# Patient Record
Sex: Female | Born: 1935 | Race: White | Hispanic: No | Smoking: Never smoker
Health system: Southern US, Community
[De-identification: ages and names within clinical notes are randomized; demographics above are authoritative.]

## PROBLEM LIST (undated history)

## (undated) DIAGNOSIS — I350 Nonrheumatic aortic (valve) stenosis: Secondary | ICD-10-CM

## (undated) DIAGNOSIS — F0280 Dementia in other diseases classified elsewhere without behavioral disturbance: Secondary | ICD-10-CM

## (undated) DIAGNOSIS — E785 Hyperlipidemia, unspecified: Secondary | ICD-10-CM

## (undated) DIAGNOSIS — C439 Malignant melanoma of skin, unspecified: Secondary | ICD-10-CM

## (undated) DIAGNOSIS — R296 Repeated falls: Secondary | ICD-10-CM

## (undated) DIAGNOSIS — H5440 Blindness, one eye, unspecified eye: Secondary | ICD-10-CM

## (undated) DIAGNOSIS — G309 Alzheimer's disease, unspecified: Secondary | ICD-10-CM

## (undated) DIAGNOSIS — I639 Cerebral infarction, unspecified: Secondary | ICD-10-CM

## (undated) DIAGNOSIS — I10 Essential (primary) hypertension: Secondary | ICD-10-CM

## (undated) DIAGNOSIS — I509 Heart failure, unspecified: Secondary | ICD-10-CM

## (undated) HISTORY — DX: Hyperlipidemia, unspecified: E78.5

## (undated) HISTORY — PX: CHOLECYSTECTOMY: SHX55

## (undated) HISTORY — DX: Essential (primary) hypertension: I10

## (undated) HISTORY — DX: Alzheimer's disease, unspecified: G30.9

## (undated) HISTORY — DX: Heart failure, unspecified: I50.9

## (undated) HISTORY — PX: PARTIAL COLECTOMY: SHX5273

## (undated) HISTORY — DX: Nonrheumatic aortic (valve) stenosis: I35.0

## (undated) HISTORY — DX: Dementia in other diseases classified elsewhere, unspecified severity, without behavioral disturbance, psychotic disturbance, mood disturbance, and anxiety: F02.80

## (undated) HISTORY — DX: Malignant melanoma of skin, unspecified: C43.9

## (undated) HISTORY — DX: Cerebral infarction, unspecified: I63.9

## (undated) HISTORY — DX: Repeated falls: R29.6

## (undated) HISTORY — DX: Blindness, one eye, unspecified eye: H54.40

---

## 2004-08-15 ENCOUNTER — Emergency Department (HOSPITAL_COMMUNITY): Admission: EM | Admit: 2004-08-15 | Discharge: 2004-08-15 | Payer: Self-pay | Admitting: Emergency Medicine

## 2005-02-23 ENCOUNTER — Emergency Department (HOSPITAL_COMMUNITY): Admission: EM | Admit: 2005-02-23 | Discharge: 2005-02-23 | Payer: Self-pay | Admitting: Emergency Medicine

## 2009-06-15 ENCOUNTER — Observation Stay (HOSPITAL_COMMUNITY): Admission: EM | Admit: 2009-06-15 | Discharge: 2009-06-19 | Payer: Self-pay | Admitting: Emergency Medicine

## 2009-06-15 ENCOUNTER — Ambulatory Visit: Payer: Self-pay | Admitting: Family Medicine

## 2009-06-15 ENCOUNTER — Ambulatory Visit: Payer: Self-pay | Admitting: Cardiovascular Disease

## 2009-12-23 ENCOUNTER — Emergency Department (HOSPITAL_COMMUNITY)
Admission: EM | Admit: 2009-12-23 | Discharge: 2009-12-23 | Payer: Self-pay | Source: Home / Self Care | Admitting: Emergency Medicine

## 2010-04-12 ENCOUNTER — Emergency Department (HOSPITAL_COMMUNITY)
Admission: EM | Admit: 2010-04-12 | Discharge: 2010-04-12 | Payer: Self-pay | Source: Home / Self Care | Admitting: Emergency Medicine

## 2010-06-27 LAB — URINALYSIS, ROUTINE W REFLEX MICROSCOPIC
Bilirubin Urine: NEGATIVE
Glucose, UA: NEGATIVE mg/dL
Ketones, ur: 15 mg/dL — AB
Specific Gravity, Urine: 1.009 (ref 1.005–1.030)
Urobilinogen, UA: 0.2 mg/dL (ref 0.0–1.0)
pH: 7.5 (ref 5.0–8.0)

## 2010-06-27 LAB — BASIC METABOLIC PANEL
BUN: 13 mg/dL (ref 6–23)
CO2: 25 mEq/L (ref 19–32)
GFR calc Af Amer: >60 mL/min (ref >60)
GFR calc non Af Amer: >60 mL/min (ref >60)
Glucose, Bld: 107 mg/dL — ABNORMAL HIGH (ref 70–99)
Potassium: 3.7 mEq/L (ref 3.5–5.1)
Sodium: 141 mEq/L (ref 135–145)

## 2010-06-27 LAB — URINE MICROSCOPIC-ADD ON

## 2010-06-27 LAB — POCT CARDIAC MARKERS: CKMB, poc: 1.2 ng/mL (ref 1.0–8.0)

## 2010-06-27 LAB — CBC
MCHC: 34 g/dL (ref 30.0–36.0)
Platelets: 163 10*3/uL (ref 150–400)
RBC: 5.26 MIL/uL — ABNORMAL HIGH (ref 3.87–5.11)

## 2010-06-30 LAB — URINALYSIS, ROUTINE W REFLEX MICROSCOPIC
Bilirubin Urine: NEGATIVE
Ketones, ur: 40 mg/dL — AB
Protein, ur: NEGATIVE mg/dL
Specific Gravity, Urine: 1.015 (ref 1.005–1.030)
Urobilinogen, UA: 0.2 mg/dL (ref 0.0–1.0)
pH: 6 (ref 5.0–8.0)

## 2010-06-30 LAB — COMPREHENSIVE METABOLIC PANEL
Albumin: 4.1 g/dL (ref 3.5–5.2)
CO2: 27 mEq/L (ref 19–32)
Calcium: 9.8 mg/dL (ref 8.4–10.5)
Chloride: 105 mEq/L (ref 96–112)
GFR calc Af Amer: >60 mL/min (ref >60)
Glucose, Bld: 136 mg/dL — ABNORMAL HIGH (ref 70–99)
Potassium: 3.6 mEq/L (ref 3.5–5.1)
Sodium: 140 mEq/L (ref 135–145)
Total Protein: 7.3 g/dL (ref 6.0–8.3)

## 2010-06-30 LAB — DIFFERENTIAL
Basophils Absolute: 0 10*3/uL (ref 0.0–0.1)
Basophils Relative: 0 % (ref 0–1)
Eosinophils Absolute: 0.1 10*3/uL (ref 0.0–0.7)
Eosinophils Relative: 1 % (ref 0–5)
Lymphocytes Relative: 5 % — ABNORMAL LOW (ref 12–46)
Monocytes Absolute: 0.5 10*3/uL (ref 0.1–1.0)

## 2010-06-30 LAB — CBC
HCT: 42.8 % (ref 36.0–46.0)
Hemoglobin: 14.2 g/dL (ref 12.0–15.0)
MCV: 86.5 fL (ref 78.0–100.0)
Platelets: 183 10*3/uL (ref 150–400)
RDW: 14.7 % (ref 11.5–15.5)

## 2010-06-30 LAB — URINE CULTURE: Culture  Setup Time: 201109081435

## 2010-07-10 LAB — DIFFERENTIAL
Basophils Absolute: 0 10*3/uL (ref 0.0–0.1)
Basophils Relative: 0 % (ref 0–1)
Eosinophils Absolute: 0.1 10*3/uL (ref 0.0–0.7)
Eosinophils Relative: 2 % (ref 0–5)
Lymphocytes Relative: 14 % (ref 12–46)
Lymphs Abs: 1.2 10*3/uL (ref 0.7–4.0)
Monocytes Absolute: 0.5 10*3/uL (ref 0.1–1.0)
Monocytes Relative: 6 % (ref 3–12)
Neutro Abs: 6.7 10*3/uL (ref 1.7–7.7)
Neutrophils Relative %: 79 % — ABNORMAL HIGH (ref 43–77)

## 2010-07-10 LAB — CBC
HCT: 39 % (ref 36.0–46.0)
HCT: 43.6 % (ref 36.0–46.0)
Hemoglobin: 13.1 g/dL (ref 12.0–15.0)
Hemoglobin: 14.6 g/dL (ref 12.0–15.0)
MCHC: 33.6 g/dL (ref 30.0–36.0)
MCHC: 33.6 g/dL (ref 30.0–36.0)
MCV: 88.3 fL (ref 78.0–100.0)
MCV: 88.3 fL (ref 78.0–100.0)
Platelets: 194 10*3/uL (ref 150–400)
Platelets: 200 10*3/uL (ref 150–400)
RBC: 4.42 MIL/uL (ref 3.87–5.11)
RBC: 4.94 MIL/uL (ref 3.87–5.11)
RDW: 14.5 % (ref 11.5–15.5)
RDW: 14.9 % (ref 11.5–15.5)
WBC: 7.6 10*3/uL (ref 4.0–10.5)
WBC: 8.6 10*3/uL (ref 4.0–10.5)

## 2010-07-10 LAB — BASIC METABOLIC PANEL WITH GFR
BUN: 15 mg/dL (ref 6–23)
BUN: 17 mg/dL (ref 6–23)
CO2: 24 meq/L (ref 19–32)
CO2: 25 meq/L (ref 19–32)
Calcium: 9.3 mg/dL (ref 8.4–10.5)
Calcium: 9.9 mg/dL (ref 8.4–10.5)
Chloride: 101 meq/L (ref 96–112)
Chloride: 102 meq/L (ref 96–112)
Creatinine, Ser: 0.83 mg/dL (ref 0.4–1.2)
Creatinine, Ser: 0.9 mg/dL (ref 0.4–1.2)
GFR calc non Af Amer: 59 mL/min — ABNORMAL LOW
GFR calc non Af Amer: >60 mL/min
Glucose, Bld: 104 mg/dL — ABNORMAL HIGH (ref 70–99)
Glucose, Bld: 110 mg/dL — ABNORMAL HIGH (ref 70–99)
Potassium: 3.3 meq/L — ABNORMAL LOW (ref 3.5–5.1)
Potassium: 3.6 meq/L (ref 3.5–5.1)
Sodium: 135 meq/L (ref 135–145)
Sodium: 135 meq/L (ref 135–145)

## 2010-07-10 LAB — BASIC METABOLIC PANEL
CO2: 22 mEq/L (ref 19–32)
Calcium: 8.8 mg/dL (ref 8.4–10.5)
Calcium: 9 mg/dL (ref 8.4–10.5)
Chloride: 110 mEq/L (ref 96–112)
Chloride: 112 mEq/L (ref 96–112)
Creatinine, Ser: 0.67 mg/dL (ref 0.4–1.2)
GFR calc Af Amer: >60 mL/min (ref >60)
GFR calc Af Amer: >60 mL/min (ref >60)
GFR calc Af Amer: >60 mL/min (ref >60)
GFR calc non Af Amer: >60 mL/min (ref >60)
Glucose, Bld: 105 mg/dL — ABNORMAL HIGH (ref 70–99)
Potassium: 3.1 mEq/L — ABNORMAL LOW (ref 3.5–5.1)
Potassium: 5.1 mEq/L (ref 3.5–5.1)
Sodium: 138 mEq/L (ref 135–145)
Sodium: 140 mEq/L (ref 135–145)
Sodium: 142 mEq/L (ref 135–145)

## 2010-07-10 LAB — URINALYSIS, ROUTINE W REFLEX MICROSCOPIC
Bilirubin Urine: NEGATIVE
Glucose, UA: NEGATIVE mg/dL
Hgb urine dipstick: NEGATIVE
Ketones, ur: NEGATIVE mg/dL
Nitrite: NEGATIVE
Protein, ur: NEGATIVE mg/dL
Specific Gravity, Urine: 1.011 (ref 1.005–1.030)
Urobilinogen, UA: 0.2 mg/dL (ref 0.0–1.0)
pH: 7 (ref 5.0–8.0)

## 2010-07-10 LAB — POTASSIUM
Potassium: 2.9 meq/L — ABNORMAL LOW (ref 3.5–5.1)
Potassium: 3.4 meq/L — ABNORMAL LOW (ref 3.5–5.1)
Potassium: 4.2 meq/L (ref 3.5–5.1)

## 2010-07-10 LAB — URINE MICROSCOPIC-ADD ON

## 2010-07-10 LAB — URINE CULTURE: Colony Count: >=100000

## 2010-07-10 LAB — MAGNESIUM: Magnesium: 2.4 mg/dL (ref 1.5–2.5)

## 2010-07-25 ENCOUNTER — Emergency Department (HOSPITAL_COMMUNITY): Payer: Medicare Other

## 2010-07-25 ENCOUNTER — Emergency Department (HOSPITAL_COMMUNITY)
Admission: EM | Admit: 2010-07-25 | Discharge: 2010-07-25 | Disposition: A | Discharge status: Home / Self Care | Payer: Medicare Other | Attending: Emergency Medicine | Admitting: Emergency Medicine

## 2010-07-25 DIAGNOSIS — I509 Heart failure, unspecified: Secondary | ICD-10-CM | POA: Insufficient documentation

## 2010-07-25 DIAGNOSIS — E785 Hyperlipidemia, unspecified: Secondary | ICD-10-CM | POA: Insufficient documentation

## 2010-07-25 DIAGNOSIS — S0003XA Contusion of scalp, initial encounter: Secondary | ICD-10-CM | POA: Insufficient documentation

## 2010-07-25 DIAGNOSIS — W06XXXA Fall from bed, initial encounter: Secondary | ICD-10-CM | POA: Insufficient documentation

## 2010-07-25 DIAGNOSIS — Z7982 Long term (current) use of aspirin: Secondary | ICD-10-CM | POA: Insufficient documentation

## 2010-07-25 DIAGNOSIS — Z79899 Other long term (current) drug therapy: Secondary | ICD-10-CM | POA: Insufficient documentation

## 2010-07-25 DIAGNOSIS — I10 Essential (primary) hypertension: Secondary | ICD-10-CM | POA: Insufficient documentation

## 2010-07-25 DIAGNOSIS — G309 Alzheimer's disease, unspecified: Secondary | ICD-10-CM | POA: Insufficient documentation

## 2010-07-25 DIAGNOSIS — S0990XA Unspecified injury of head, initial encounter: Secondary | ICD-10-CM | POA: Insufficient documentation

## 2010-07-25 DIAGNOSIS — H5440 Blindness, one eye, unspecified eye: Secondary | ICD-10-CM | POA: Insufficient documentation

## 2010-07-25 DIAGNOSIS — F0280 Dementia in other diseases classified elsewhere without behavioral disturbance: Secondary | ICD-10-CM | POA: Insufficient documentation

## 2010-07-25 DIAGNOSIS — Y9210 Unspecified residential institution as the place of occurrence of the external cause: Secondary | ICD-10-CM | POA: Insufficient documentation

## 2010-07-25 DIAGNOSIS — Z8673 Personal history of transient ischemic attack (TIA), and cerebral infarction without residual deficits: Secondary | ICD-10-CM | POA: Insufficient documentation

## 2010-07-25 DIAGNOSIS — S5010XA Contusion of unspecified forearm, initial encounter: Secondary | ICD-10-CM | POA: Insufficient documentation

## 2010-09-30 ENCOUNTER — Emergency Department (HOSPITAL_COMMUNITY): Payer: Medicare Other

## 2010-09-30 ENCOUNTER — Emergency Department (HOSPITAL_COMMUNITY)
Admission: EM | Admit: 2010-09-30 | Discharge: 2010-09-30 | Disposition: A | Discharge status: Home / Self Care | Payer: Medicare Other | Attending: Emergency Medicine | Admitting: Emergency Medicine

## 2010-09-30 DIAGNOSIS — I10 Essential (primary) hypertension: Secondary | ICD-10-CM | POA: Insufficient documentation

## 2010-09-30 DIAGNOSIS — R109 Unspecified abdominal pain: Secondary | ICD-10-CM | POA: Insufficient documentation

## 2010-09-30 DIAGNOSIS — Y92009 Unspecified place in unspecified non-institutional (private) residence as the place of occurrence of the external cause: Secondary | ICD-10-CM | POA: Insufficient documentation

## 2010-09-30 DIAGNOSIS — Z8673 Personal history of transient ischemic attack (TIA), and cerebral infarction without residual deficits: Secondary | ICD-10-CM | POA: Insufficient documentation

## 2010-09-30 DIAGNOSIS — W19XXXA Unspecified fall, initial encounter: Secondary | ICD-10-CM | POA: Insufficient documentation

## 2010-09-30 DIAGNOSIS — H5440 Blindness, one eye, unspecified eye: Secondary | ICD-10-CM | POA: Insufficient documentation

## 2010-09-30 DIAGNOSIS — G309 Alzheimer's disease, unspecified: Secondary | ICD-10-CM | POA: Insufficient documentation

## 2010-09-30 DIAGNOSIS — F0280 Dementia in other diseases classified elsewhere without behavioral disturbance: Secondary | ICD-10-CM | POA: Insufficient documentation

## 2010-09-30 DIAGNOSIS — R079 Chest pain, unspecified: Secondary | ICD-10-CM | POA: Insufficient documentation

## 2010-09-30 DIAGNOSIS — S301XXA Contusion of abdominal wall, initial encounter: Secondary | ICD-10-CM | POA: Insufficient documentation

## 2010-09-30 DIAGNOSIS — I509 Heart failure, unspecified: Secondary | ICD-10-CM | POA: Insufficient documentation

## 2010-09-30 DIAGNOSIS — E785 Hyperlipidemia, unspecified: Secondary | ICD-10-CM | POA: Insufficient documentation

## 2010-09-30 LAB — URINE MICROSCOPIC-ADD ON

## 2010-09-30 LAB — DIFFERENTIAL
Eosinophils Relative: 3 % (ref 0–5)
Lymphocytes Relative: 18 % (ref 12–46)
Lymphs Abs: 1.7 10*3/uL (ref 0.7–4.0)
Monocytes Absolute: 0.6 10*3/uL (ref 0.1–1.0)

## 2010-09-30 LAB — URINALYSIS, ROUTINE W REFLEX MICROSCOPIC
Glucose, UA: NEGATIVE mg/dL
Protein, ur: NEGATIVE mg/dL
Specific Gravity, Urine: 1.017 (ref 1.005–1.030)
Urobilinogen, UA: 0.2 mg/dL (ref 0.0–1.0)

## 2010-09-30 LAB — CBC
HCT: 41.1 % (ref 36.0–46.0)
MCHC: 32.8 g/dL (ref 30.0–36.0)
MCV: 88.8 fL (ref 78.0–100.0)
RDW: 13.2 % (ref 11.5–15.5)

## 2010-09-30 LAB — BASIC METABOLIC PANEL
BUN: 22 mg/dL (ref 6–23)
Calcium: 9.8 mg/dL (ref 8.4–10.5)
Creatinine, Ser: 0.71 mg/dL (ref 0.50–1.10)
GFR calc Af Amer: >60 mL/min (ref >60)
GFR calc non Af Amer: >60 mL/min (ref >60)

## 2010-09-30 MED ORDER — IOHEXOL 300 MG/ML  SOLN
100.0000 mL | Freq: Once | INTRAMUSCULAR | Status: AC | PRN
  Administered 2010-09-30: 100 mL via INTRAVENOUS

## 2011-02-18 ENCOUNTER — Encounter: Payer: Self-pay | Admitting: Internal Medicine

## 2011-02-18 ENCOUNTER — Inpatient Hospital Stay (HOSPITAL_COMMUNITY)
Admission: EM | Admit: 2011-02-18 | Discharge: 2011-02-20 | DRG: 057 | Disposition: A | Discharge status: Skilled Nursing Facility | Payer: Medicare Other | Attending: Infectious Diseases | Admitting: Infectious Diseases

## 2011-02-18 ENCOUNTER — Emergency Department (HOSPITAL_COMMUNITY): Payer: Medicare Other

## 2011-02-18 DIAGNOSIS — T40605A Adverse effect of unspecified narcotics, initial encounter: Secondary | ICD-10-CM | POA: Diagnosis present

## 2011-02-18 DIAGNOSIS — I10 Essential (primary) hypertension: Secondary | ICD-10-CM | POA: Insufficient documentation

## 2011-02-18 DIAGNOSIS — M25559 Pain in unspecified hip: Secondary | ICD-10-CM | POA: Diagnosis present

## 2011-02-18 DIAGNOSIS — R32 Unspecified urinary incontinence: Secondary | ICD-10-CM | POA: Insufficient documentation

## 2011-02-18 DIAGNOSIS — E785 Hyperlipidemia, unspecified: Secondary | ICD-10-CM | POA: Insufficient documentation

## 2011-02-18 DIAGNOSIS — F0280 Dementia in other diseases classified elsewhere without behavioral disturbance: Principal | ICD-10-CM | POA: Diagnosis present

## 2011-02-18 DIAGNOSIS — G309 Alzheimer's disease, unspecified: Secondary | ICD-10-CM

## 2011-02-18 DIAGNOSIS — W19XXXA Unspecified fall, initial encounter: Secondary | ICD-10-CM | POA: Diagnosis present

## 2011-02-18 DIAGNOSIS — Z9181 History of falling: Secondary | ICD-10-CM

## 2011-02-18 DIAGNOSIS — I509 Heart failure, unspecified: Secondary | ICD-10-CM | POA: Insufficient documentation

## 2011-02-18 DIAGNOSIS — Y998 Other external cause status: Secondary | ICD-10-CM

## 2011-02-18 DIAGNOSIS — I639 Cerebral infarction, unspecified: Secondary | ICD-10-CM | POA: Insufficient documentation

## 2011-02-18 DIAGNOSIS — H5440 Blindness, one eye, unspecified eye: Secondary | ICD-10-CM | POA: Diagnosis present

## 2011-02-18 DIAGNOSIS — R296 Repeated falls: Secondary | ICD-10-CM | POA: Diagnosis present

## 2011-02-18 DIAGNOSIS — C439 Malignant melanoma of skin, unspecified: Secondary | ICD-10-CM | POA: Insufficient documentation

## 2011-02-18 DIAGNOSIS — N390 Urinary tract infection, site not specified: Secondary | ICD-10-CM | POA: Diagnosis present

## 2011-02-18 DIAGNOSIS — I350 Nonrheumatic aortic (valve) stenosis: Secondary | ICD-10-CM | POA: Insufficient documentation

## 2011-02-18 LAB — COMPREHENSIVE METABOLIC PANEL
AST: 19 U/L (ref 0–37)
Albumin: 3.5 g/dL (ref 3.5–5.2)
Alkaline Phosphatase: 51 U/L (ref 39–117)
Chloride: 107 mEq/L (ref 96–112)
Potassium: 3.6 mEq/L (ref 3.5–5.1)
Sodium: 141 mEq/L (ref 135–145)
Total Bilirubin: 0.6 mg/dL (ref 0.3–1.2)
Total Protein: 6.6 g/dL (ref 6.0–8.3)

## 2011-02-18 LAB — URINALYSIS, ROUTINE W REFLEX MICROSCOPIC
Glucose, UA: NEGATIVE mg/dL
Hgb urine dipstick: NEGATIVE
Ketones, ur: NEGATIVE mg/dL
Leukocytes, UA: NEGATIVE
Nitrite: NEGATIVE
Protein, ur: NEGATIVE mg/dL
Specific Gravity, Urine: 1.011 (ref 1.005–1.030)
Urobilinogen, UA: 0.2 mg/dL (ref 0.0–1.0)
pH: 7 (ref 5.0–8.0)
pH: 7.5 (ref 5.0–8.0)

## 2011-02-18 LAB — CBC
MCHC: 33.8 g/dL (ref 30.0–36.0)
Platelets: 176 10*3/uL (ref 150–400)
RDW: 13.7 % (ref 11.5–15.5)
WBC: 7 10*3/uL (ref 4.0–10.5)

## 2011-02-18 LAB — DIFFERENTIAL
Basophils Absolute: 0 10*3/uL (ref 0.0–0.1)
Basophils Relative: 0 % (ref 0–1)
Eosinophils Absolute: 0.2 10*3/uL (ref 0.0–0.7)
Eosinophils Relative: 3 % (ref 0–5)
Lymphocytes Relative: 29 % (ref 12–46)

## 2011-02-18 LAB — URINE MICROSCOPIC-ADD ON

## 2011-02-18 MED ORDER — SERTRALINE HCL 50 MG PO TABS
50.0000 mg | ORAL_TABLET | Freq: Every day | ORAL | Status: DC
  Filled 2011-02-18 (×3): qty 1

## 2011-02-18 MED ORDER — ACETAMINOPHEN 325 MG PO TABS: 650.0000 mg | ORAL_TABLET | Freq: Four times a day (QID) | ORAL | Status: DC | PRN

## 2011-02-18 MED ORDER — ENOXAPARIN SODIUM 40 MG/0.4ML ~~LOC~~ SOLN
40.0000 mg | Freq: Every day | SUBCUTANEOUS | Status: DC
  Administered 2011-02-19: 40 mg via SUBCUTANEOUS
  Filled 2011-02-18 (×3): qty 0.4

## 2011-02-18 MED ORDER — SODIUM CHLORIDE 0.9 % IJ SOLN
3.0000 mL | Freq: Two times a day (BID) | INTRAMUSCULAR | Status: DC
  Administered 2011-02-19 (×2): 3 mL via INTRAVENOUS

## 2011-02-18 MED ORDER — IPRATROPIUM BROMIDE 0.03 % NA SOLN: 2.0000 | Freq: Two times a day (BID) | NASAL | Status: DC | PRN

## 2011-02-18 MED ORDER — HYPROMELLOSE (GONIOSCOPIC) 2.5 % OP SOLN
1.0000 [drp] | Freq: Every day | OPHTHALMIC | Status: DC
  Filled 2011-02-18: qty 15

## 2011-02-18 MED ORDER — INFLUENZA VIRUS VACC SPLIT PF IM SUSP
0.5000 mL | Freq: Once | INTRAMUSCULAR | Status: DC
  Filled 2011-02-18: qty 0.5

## 2011-02-18 MED ORDER — TRAMADOL HCL 50 MG PO TABS
50.0000 mg | ORAL_TABLET | ORAL | Status: DC | PRN
  Filled 2011-02-18: qty 1

## 2011-02-18 MED ORDER — ASPIRIN EC 81 MG PO TBEC
81.0000 mg | DELAYED_RELEASE_TABLET | Freq: Every day | ORAL | Status: DC
  Administered 2011-02-19 – 2011-02-20 (×2): 81 mg via ORAL
  Filled 2011-02-18 (×2): qty 1

## 2011-02-18 NOTE — H&P (Signed)
Hospital Admission Note Date: 02/18/2011  Patient name: Ruth [name] Medical record number: [id number] Date of birth: 24-Jan-1917 Age: 77 y.o. Gender: female PCP: Dr. Florentina Jenny Lives at [facility] assisted living facility, p. [phone]  Medical Service: Internal Medicine Teaching Service   Attending physician: Johny Sax  1st Contact: Janalyn Harder   Pager: [phone]  2nd Contact: Almyra Deforest   Pager: [phone]  After 5 pm or weekends:  1st Contact:     Pager: [phone]  2nd Contact:     Pager: [phone]  Chief Complaint: Fall  History of Present Illness: The patient is a 77 yo woman, history of CHF, HTN, alzheimer's, R eye blindness, presenting s/p fall with right hip pain.  Most of the history was obtained from the patient's daughter, as the patient was confused at the time of the interview.  Around 1:30 a.m. on the morning of admission, the patient had an unwitnessed fall in her assisted living facility.  The daughter did not get a full report of the fall, but believes the patient did not hit her head or hit any furniture, and did not lose consciousness.  After the fall, the patient reported significant right hip pain, prompting her to seek medical attention.  The patient has a history of several prior falls in the last year, most recently 09/30/10, prompting ED evaluation, none of which resulted in fractures.  X-ray and MRI obtained in the ED showed no acute fracture at this time, but the patient continues to note significant pain.  The patient reports no other symptoms surrounding the fall, but the nursing staff at her assisted living facility reported decreased orientation during the week prior to the fall, and suspected a UTI.  Meds: Atrovent 0.03% nasal spray, 2 sprays BID prn Melatonin 3 mg PO, 0.5 tab qhs Aspirin 81 mg PO BID Ibuprofen 400 mg PO 2 tabs TID prn Artificial tears 1.4% ophthalmic solution, 1 drop to both eyes daily prn Co-Q 10, 100 mg PO qam MVI 1 tab PO  qam Ibandronate 150 mg PO monthly Sertraline 50 mg PO qhs Lovastatin 20 mg PO qhs Donepezil 10 mg PO qhs Memantine 5 mg PO BID Tylenol 500 mg PO BID Calcium carbonate/vit D, 600/400 mg PO BID Ramipril 5 mg PO qhs Genatmicin 40 mg/mL injection, 120 mg q12hrs  Allergies: Unknown prior pain medication - disorientation  PMH CHF - EF 55-60% on 06/18/09 Hypertension Allergic rhinitis Alzheimer's disease - with severe dementia Aortic stenosis Hyperlipidemia Melanoma Prior CVA Urinary incontinence R eye blindness - s/p CVA  Surg Hx Cholecystectomy Partial colectomy  SH Lives at assisted living facility, alone Non-smoker No alcohol use No drug use  Review of Systems: General: no fevers, chills, changes in weight, changes in appetite Skin: no rash HEENT: no blurry vision, hearing changes, sore throat Pulm: no dyspnea, coughing, wheezing CV: no chest pain, palpitations, shortness of breath Abd: no abdominal pain, nausea/vomiting, diarrhea/constipation GU: no dysuria, hematuria, polyuria Ext: no arthralgias, myalgias Neuro: no weakness, numbness, or tingling  Physical Exam: T: 97.0,  BP: 120/65,  HR: 68,  RR: 20,  O2 sat: 97% General: talkative, tangential, disoriented HEENT: left PERRL, right eye non-reactive, oropharynx moist and non-erythematous Neck: supple, no lymphadenopathy,no  JVD Lungs: clear to ascultation bilaterally, normal work of respiration, no wheezes, rales, ronchi Heart: regular rate and rhythm, Grade III/VI systolic murmur best heard at the RUSB Abdomen: soft, non-tender, non-distended, normal bowel sounds Extremities: R hip tender to palpation posteriorly, with no visible hematoma, laceration, or bony  protrusion. no cyanosis, clubbing, or edema Neurologic: alert & oriented X3, cranial nerves II-XII grossly intact, strength grossly intact, sensation intact to light touch  Lab results: Basic Metabolic Panel:  Basename 02/18/11 1004  NA 141  K 3.6   CL 107  CO2 24  GLUCOSE 92  BUN 15  CREATININE 0.76  CALCIUM 9.6  MG --  PHOS --   Liver Function Tests:  Basename 02/18/11 1004  AST 19  ALT 9  ALKPHOS 51  BILITOT 0.6  PROT 6.6  ALBUMIN 3.5   CBC:  Basename 02/18/11 1004  WBC 7.0  NEUTROABS 4.0  HGB 13.3  HCT 39.4  MCV 89.3  PLT 176   Urinalysis:  Color, Urine                             YELLOW            YELLOW  Appearance                               CLEAR             CLEAR  Specific Gravity                         1.014             1.005-1.030  pH                                       7.0               5.0-8.0  Urine Glucose                            NEGATIVE          NEG              mg/dL  Bilirubin                                NEGATIVE          NEG  Ketones                                  NEGATIVE          NEG              mg/dL  Blood                                    NEGATIVE          NEG  Protein                                  NEGATIVE          NEG              mg/dL  Urobilinogen  0.2               0.0-1.0          mg/dL  Nitrite                                  NEGATIVE          NEG  Leukocytes                               MODERATE   a      NEG Squamous Epithelial / LPF                RARE              RARE  WBC / HPF                                21-50             <3               WBC/hpf  Bacteria / HPF                           RARE              RARE  Imaging results:  Dg Lumbar Spine 2-3 Views  02/18/2011  *RADIOLOGY REPORT*  Clinical Data: Lower back pain status post fall.  LUMBAR SPINE - 2-3 VIEW  Comparison: 09/30/2010 CT  Findings: Multilevel degenerative changes.  Surgical clips project over the right upper quadrant and left mid abdomen. No acute fracture or dislocation identified.  The sacrum is obscured by overlying bowel gas.  IMPRESSION: Multilevel degenerative changes.  No acute fracture or dislocation identified.  Original Report Authenticated By:  Waneta Martins, M.D.   Dg Hip Complete Right  02/18/2011  *RADIOLOGY REPORT*  Clinical Data: Right hip pain status post fall.  RIGHT HIP - COMPLETE 2+ VIEW  Comparison: 06/15/2009  Findings: Diffuse osteopenia.  Advanced right hip DJD. No displaced acute fracture identified.  Enthesopathic changes along bilateral ischia and greater trochanter.  Surgical clips project over the lower abdomen.  Multilevel degenerative changes of the lumbosacral spine.  IMPRESSION: No acute displaced fracture identified. If clinical concern for a fracture persists, recommend MRI or a repeat radiograph in 5-7 days to evaluate for interval change or callus formation.  Original Report Authenticated By: Waneta Martins, M.D.   Mr Pelvis Wo Contrast  02/18/2011  *RADIOLOGY REPORT*  Clinical Data: Pelvic and hip pain, worse on the right, status post fall.  History of dementia.  MRI PELVIS WITHOUT CONTRAST  Technique:  Multi-planar multi-sequence MR imaging of the pelvis was performed following the standard protocol. No intravenous contrast was administered.  Comparison: Right hip radiographs 02/18/2011 and pelvic CT 09/30/2010.  Findings: There is mild asymmetric subcutaneous edema superiorly in the right buttock.  No focal fluid collection is identified.  There is no significant muscular edema.  The iliopsoas, gluteus and hamstring tendons are intact bilaterally.  There is bilateral enthesopathic change at the hamstring origins on the ischial tuberosities.  A small amount of hip joint fluid is present bilaterally.  There is no evidence of proximal femur fracture.  There is no bone marrow edema or displaced cortical  fracture in the pelvis.  Lower lumbar spine degenerative changes are noted.  The sacroiliac joints appear normal.  The visualized internal pelvic contents appear unremarkable aside from moderate sigmoid colon diverticulosis.  IMPRESSION:  1.  Suspected mild subcutaneous hematoma superiorly in the right buttock. 2.  No  evidence of pelvic fracture or dislocation.  Original Report Authenticated By: Gerrianne Scale, M.D.    Assessment & Plan by Problem: The patient is a 77 yo woman, history of CHF, HTN, alzheimer's disease, and R eye blindness, with a history of multiple prior falls, presenting with a fall to the right hip, with no radiologic evidence of fracture.  We will admit for pain control, and to facilitate placement.  1. Fall - likely a mechanical fall contributed to by dementia and R eye blindness.  Possibly also contributed to by a medication side effect.  Unlikely cause by hypoglycemia, given normal blood glucose on admission.  Patient will likely need a home environment with closer supervision to prevent future falls. -will keep patient oriented while in-hospital, and avoid any disorienting medications -patient will likely need increased care in her assisted living facility  2. R hip pain - s/p fall, no evidence of fracture on MRI.  Will control pain conservatively, and avoid over-sedation.  Patient given fentanyl in ED, with some proceeding disorientation. -tramadol for pain, may increase as needed or advance to morphine, while trying to avoid sedation -PT/OT consult to improve functional status  3. CHF - history of CHF, EF 55-60%.  No acute symptoms of CHF exacerbation, no evidence of volume overload -continue to monitor  4. Abnormal urinalysis - UA showed negative nitrates, moderate Leuk Esterase, rare squams, 21-50 WBC's.  Borderline for UTI.  It is unknown at this point if this was a clean catch urine, given history of urinary incontinence. -consider repeat UA vs empiric treatment -urine culture in progress for antibiotic sensitivities.  5. Dispo - likely d/c back to [facility] with an increased level of assistance, once pain is well-controlled -PT/OT assessment  6. Code status: Patient's family reports that she has a living will, and they will bring this document by the hospital soon.   Until further notice, patient is full code.     R2/3______________________________ Almyra Deforest, MD     R1________________________________ Janalyn Harder, MD  ATTENDING: I performed and/or observed a history and physical examination of the patient.  I discussed the case with the residents as noted and reviewed the residents' notes.  I agree with the findings and plan--please refer to the attending physician note for more details.  Signature________________________________  Printed Name_____________________________

## 2011-02-19 DIAGNOSIS — Z9181 History of falling: Secondary | ICD-10-CM

## 2011-02-19 MED ORDER — HALOPERIDOL LACTATE 5 MG/ML IJ SOLN: 1.0000 mg | INTRAMUSCULAR | Status: DC | PRN

## 2011-02-19 MED ORDER — POLYVINYL ALCOHOL 1.4 % OP SOLN
1.0000 [drp] | Freq: Every day | OPHTHALMIC | Status: DC
  Administered 2011-02-19 – 2011-02-20 (×2): 1 [drp] via OPHTHALMIC

## 2011-02-19 NOTE — Progress Notes (Signed)
Physical Therapy Evaluation Patient Details Name: Ruth [name] MRN: [id number] DOB: Aug 30, 1916 Today's Date: 02/19/2011  Problem List:  Patient Active Problem List  Diagnoses  . CHF (congestive heart failure)  . HTN (hypertension)  . Alzheimer's dementia  . Stroke  . Blindness of right eye  . Aortic stenosis  . Hyperlipidemia  . Urinary incontinence  . Melanoma  . Frequent falls    Past Medical History:  Past Medical History  Diagnosis Date  . CHF (congestive heart failure)     EF 55-60% on 06/18/09  . HTN (hypertension)   . Alzheimer's dementia     significant dementia  . Stroke     History of CVA, with resulting R eye blindness  . Blindness of right eye     s/p stroke  . Aortic stenosis   . Hyperlipidemia   . Urinary incontinence     wears diapers at baseline  . Melanoma   . Frequent falls     several falls, likely mechanical   Past Surgical History:  Past Surgical History  Procedure Date  . Cholecystectomy   . Partial colectomy     PT Assessment/Plan/Recommendation PT Assessment Clinical Impression Statement: Pt at increased risk for falls secondary to dynamic/static balance deficits. Pt has also had a decline in cognition which amplifies fall risk. Recommend SNF with eventual continuation of care at Memory Unit at ALF if family agrees.  PT Recommendation/Assessment: Patient will need skilled PT in the acute care venue PT Problem List: Decreased strength;Decreased activity tolerance;Decreased balance;Decreased mobility;Decreased cognition;Decreased knowledge of use of DME;Decreased safety awareness;Cardiopulmonary status limiting activity Barriers to Discharge: Decreased caregiver support Barriers to Discharge Comments: Pt needs 24 hour supervision PT Therapy Diagnosis : Difficulty walking;Abnormality of gait;Generalized weakness;Altered mental status PT Plan PT Frequency: Min 3X/week PT Treatment/Interventions: DME instruction;Gait training;Functional  mobility training;Therapeutic exercise;Balance training;Neuromuscular re-education;Patient/family education PT Recommendation Recommendations for Other Services:  (None) Follow Up Recommendations: Skilled nursing facility Equipment Recommended: None recommended by PT PT Goals  Acute Rehab PT Goals PT Goal Formulation: With patient/family Time For Goal Achievement: 2 weeks Pt will Roll Supine to Right Side: with supervision PT Goal: Rolling Supine to Right Side - Progress: Progressing toward goal Pt will Roll Supine to Left Side: with supervision PT Goal: Rolling Supine to Left Side - Progress: Progressing toward goal Pt will go Supine/Side to Sit: with supervision PT Goal: Supine/Side to Sit - Progress: Progressing toward goal Pt will go Sit to Supine/Side: with supervision PT Goal: Sit to Supine/Side - Progress: Progressing toward goal Pt will Transfer Sit to Stand/Stand to Sit: with supervision PT Transfer Goal: Sit to Stand/Stand to Sit - Progress: Progressing toward goal Pt will Ambulate: 51 - 150 feet;with supervision PT Goal: Ambulate - Progress: Progressing toward goal  PT Evaluation Precautions/Restrictions    Prior Functioning  Home Living Lives With:  (at ALF) Receives Help From:  (ALF) Type of Home: Assisted living Home Adaptive Equipment: Walker - rolling Prior Function Level of Independence: Needs assistance with ADLs;Requires assistive device for independence;Needs assistance with gait Bath: Moderate Toileting: Moderate Dressing: Moderate Grooming: Moderate Feeding: Minimal Driving: No Vocation: Unemployed Financial risk analyst Arousal/Alertness: Awake/alert Overall Cognitive Status: History of cognitive impairments History of Cognitive Impairment: Decline in baseline functioning Memory: Appears impaired Memory Deficits: Decreased ability to remember corrections, unable to remember son earlier, decreased processing.  Following Commands: Follows one step  commands inconsistently;Follows one step commands with increased time Safety/Judgement: Decreased awareness of safety precautions Decreased Safety/Judgement: Decreased awareness of need for  assistance Sensation/Coordination Sensation Additional Comments: unable to accurately assess secondary to cognition Extremity Assessment RLE Assessment RLE Assessment:  (Generalized deconditioning, grossly >/= 3-/5) LLE Assessment LLE Assessment:  (Generalized deconditioning, grossly >/= 3-/5. ) Mobility (including Balance) Bed Mobility Bed Mobility: Yes Supine to Sit: 3: Mod assist Supine to Sit Details (indicate cue type and reason): Verbal cues for sequence, tactile cues for UE use and placement/advancement for Rt.LE. Transfers Transfers: Yes Sit to Stand: 4: Min assist;From bed;From chair/3-in-1 Sit to Stand Details (indicate cue type and reason): Verbal cues for UE placement. Stand to Sit: 3: Mod assist;To chair/3-in-1 Stand to Sit Details: Verbal/tactile cues for use of armrest and to control descent Ambulation/Gait Ambulation/Gait: Yes Ambulation/Gait Assistance: 3: Mod assist Ambulation/Gait Assistance Details (indicate cue type and reason): PT to assist with negotiation of RW, support pt secondary to decreased balance and lower extremity weakness.  Ambulation Distance (Feet): 35 Feet Assistive device: Rolling walker Gait Pattern: Shuffle (Some knee buckling, excessive hip/knee flexion) Stairs: No Wheelchair Mobility Wheelchair Mobility: No  Posture/Postural Control Posture/Postural Control: Postural limitations Balance Balance Assessed: Yes Static Standing Balance Static Standing - Balance Support: Left upper extremity supported Static Standing - Level of Assistance: 4: Min assist Static Standing - Comment/# of Minutes: Approximately 3-4 min Dynamic Standing Balance Dynamic Standing - Balance Support: No upper extremity supported Dynamic Standing - Level of Assistance: 3: Mod  assist Dynamic Standing - Balance Activities: Reaching for objects (Pt requiring frequent UE support) Exercise    End of Session PT - End of Session Equipment Utilized During Treatment: Gait belt Activity Tolerance: Patient tolerated treatment well;Patient limited by fatigue Patient left: in chair;with call bell in reach;with family/visitor present Nurse Communication: Mobility status for ambulation General Behavior During Session: [facility] for tasks performed Cognition: Impaired, at baseline  Sherie Don 02/19/2011, 2:09 PM Thereasa Parkin PT, DPT is my name by marriage and on my boards currently. Working on changing in the system!

## 2011-02-19 NOTE — Patient Instructions (Signed)
Pt's daughter instructed pt to have 24 hour supervision currently and to call nursing if she is to leave.

## 2011-02-19 NOTE — Progress Notes (Signed)
CSW met with pt's dtr, Ruth [name], in pt's room RE: return to facility. Pt current resident at [facility] at a level 3. PT/OT recommendation for increased level of care noted. CSW contacted ALF RE: ability to accommodate pt needs. Admissions ([facility]) not available over weekend. Will f/u with ALF Monday. Pt's children prefer to have pt return to ALF if higher level of care available. Full CSW assessment and FL2 on shadow chart. CSW to follow.  [phone] (weekend cover)

## 2011-02-19 NOTE — Progress Notes (Signed)
Occupational Therapy Evaluation Patient Details Name: Ruth [name] MRN: [id number] DOB: [birth year] Today's Date: 02/19/2011  Problem List:  Patient Active Problem List  Diagnoses  . CHF (congestive heart failure)  . HTN (hypertension)  . Alzheimer's dementia  . Stroke  . Blindness of right eye  . Aortic stenosis  . Hyperlipidemia  . Urinary incontinence  . Melanoma  . Frequent falls    Past Medical History:  Past Medical History  Diagnosis Date  . CHF (congestive heart failure)     EF 55-60% on 06/18/09  . HTN (hypertension)   . Alzheimer's dementia     significant dementia  . Stroke     History of CVA, with resulting R eye blindness  . Blindness of right eye     s/p stroke  . Aortic stenosis   . Hyperlipidemia   . Urinary incontinence     wears diapers at baseline  . Melanoma   . Frequent falls     several falls, likely mechanical   Past Surgical History:  Past Surgical History  Procedure Date  . Cholecystectomy   . Partial colectomy     OT Assessment/Plan/Recommendation OT Assessment Clinical Impression Statement: pt with decreased balance. decrease cognition, fall risk, increased assistance with BADLS OT Recommendation/Assessment: Patient will need skilled OT in the acute care venue OT Problem List: Decreased strength;Decreased activity tolerance;Impaired balance (sitting and/or standing);Decreased cognition;Decreased safety awareness OT Therapy Diagnosis : Cognitive deficits OT Plan OT Frequency: Min 1X/week OT Treatment/Interventions: Self-care/ADL training;Therapeutic activities;Cognitive remediation/compensation;Patient/family education;Balance training OT Recommendation Follow Up Recommendations: Skilled nursing facility Equipment Recommended: None recommended by PT Individuals Consulted Consulted and Agree with Results and Recommendations: Family member/caregiver;Patient OT Goals Acute Rehab OT Goals OT Goal Formulation: With  patient/family Time For Goal Achievement: 2 weeks ADL Goals Pt Will Perform Upper Body Bathing: with supervision;Sitting, edge of bed;Unsupported;with cueing (comment type and amount) (visual and verbal) Pt Will Perform Lower Body Bathing: with supervision;Sit to stand from bed;with cueing (comment type and amount) (verbal and visual) Pt Will Perform Upper Body Dressing: with supervision;Sitting, chair Pt Will Perform Lower Body Dressing: with supervision;Sitting, chair Pt Will Transfer to Toilet: with min assist;3-in-1;with cueing (comment type and amount) (verbal min A)  OT Evaluation Precautions/Restrictions  Precautions Precautions: Fall Precaution Comments: pt unaware of balance deficits Restrictions Weight Bearing Restrictions: No Prior Functioning Home Living Lives With:  (at ALF) Receives Help From:  (ALF) Type of Home: Assisted living Home Adaptive Equipment: Walker - rolling Prior Function Level of Independence: Needs assistance with ADLs;Requires assistive device for independence;Needs assistance with gait Bath: Moderate Toileting: Moderate Dressing: Moderate Grooming: Moderate Feeding: Minimal Driving: No Vocation: Unemployed ADL ADL Eating/Feeding: Performed;Moderate assistance;Other (comment) (dgter feeding pt on arrival, pt with focused attention ) Eating/Feeding Details (indicate cue type and reason): attention limiting participation Where Assessed - Eating/Feeding: Other (comment) (supine HOB up) Grooming: Performed;Wash/dry hands;Wash/dry face;Teeth care;Moderate assistance;Other (comment) (multimodal cues during grooming) Grooming Details (indicate cue type and reason): command to wash hands and pt washed face with soap.  (terminating oral care prematurely and rinsing mouth ) Where Assessed - Grooming: Sitting at sink;Other (comment) (rinsing mouth continuously) Upper Body Bathing: Not assessed Upper Body Dressing: Minimal assistance Where Assessed - Upper  Body Dressing: Sitting, bed Lower Body Dressing: +1 Total assistance Where Assessed - Lower Body Dressing: Sitting, bed (don socks and new adult brief) Toilet Transfer: Moderate assistance Toilet Transfer Method: Stand pivot Toilet Transfer Equipment: Raised toilet seat with arms (or 3-in-1 over toilet) Toileting - Clothing  Manipulation: Maximal assistance Where Assessed - Toileting Clothing Manipulation: Sit to stand from 3-in-1 or toilet Toileting - Hygiene: Maximal assistance Where Assessed - Toileting Hygiene: Sit to stand from 3-in-1 or toilet Tub/Shower Transfer: Not assessed ADL Comments: pt with poor hygiene and requires Max A Vision/Perception  Vision - History Baseline Vision: Legally blind (visual deficits at baseline per dgter but does not wear glas) Visual History:  (legally blind in one eye) Cognition Cognition Arousal/Alertness: Awake/alert Overall Cognitive Status: History of cognitive impairments History of Cognitive Impairment: Decline in baseline functioning Memory: Appears impaired Memory Deficits: Decreased ability to remember corrections, unable to remember son earlier, decreased processing.  Following Commands: Follows one step commands inconsistently;Follows one step commands with increased time Safety/Judgement: Decreased awareness of safety precautions Decreased Safety/Judgement: Decreased awareness of need for assistance Sensation/Coordination Sensation Additional Comments: unable to accurately assess secondary to cognition Coordination Gross Motor Movements are Fluid and Coordinated: Yes Extremity Assessment RUE Assessment RUE Assessment: Within Functional Limits LUE Assessment LUE Assessment: Within Functional Limits Mobility  Bed Mobility Bed Mobility: Yes Rolling Left: 4: Min assist Left Sidelying to Sit: 3: Mod assist;HOB elevated (comment degrees) (30) Supine to Sit: 3: Mod assist Supine to Sit Details (indicate cue type and reason): Verbal  cues for sequence, tactile cues for UE use and placement/advancement for Rt.LE. Transfers Transfers: Yes Sit to Stand: 4: Min assist;From bed;From chair/3-in-1 Sit to Stand Details (indicate cue type and reason): Verbal cues for UE placement. Stand to Sit: 3: Mod assist;To chair/3-in-1 Stand to Sit Details: Verbal/tactile cues for use of armrest and to control descent Exercises   End of Session OT - End of Session Equipment Utilized During Treatment: Gait belt (RW) Activity Tolerance: Patient tolerated treatment well Patient left: in chair;with family/visitor present General Behavior During Session: [facility] for tasks performed Cognition: Impaired, at baseline   Lucile Shutters MS OTR/L 02/19/2011, 3:26 PM

## 2011-02-19 NOTE — Progress Notes (Signed)
Subjective: Patient doing well this morning.  Continues to report hip pain with movement, but does not appear to be in pain while lying down.  Patient had some disorientation overnight, and required 1 dose of haldol.  We are managing her pain with PO tylenol/tramadol, and avoiding narcotic pain medications due to potential side effects, including disorientation.  Awaiting PT/OT consult, placement back to [facility] with increased level of care.  Objective: Vital signs in last 24 hours: Filed Vitals:   02/18/11 1900 02/19/11 0629  BP:  113/81  Pulse:  76  Temp:  97.5 F (36.4 C)  TempSrc:  Oral  Resp:  18  Height: 5\' 2"  (1.575 m)   Weight: 122 lb (55.338 kg)   SpO2:  100%   PEX General: alert, cooperative, and in no apparent distress HEENT: pupils equal round and reactive to light, vision grossly intact, oropharynx clear and non-erythematous  Neck: supple, no lymphadenopathy, JVD, or carotid bruits Lungs: clear to ascultation bilaterally, normal work of respiration, no wheezes, rales, ronchi Heart: regular rate and rhythm, grade III/VI systolic murmur best heart at RUSB Abdomen: soft, non-tender, non-distended, normal bowel sounds Msk: R hip tender to palpation posteriorly, with no visible hematoma, laceration, or bony protrusion, minimally improved since yesterday Neurologic: alert & oriented X3, cranial nerves II-XII grossly intact, strength grossly intact, sensation intact to light touch  Lab Results: Basic Metabolic Panel:  Lab 02/18/11 1478  NA 141  K 3.6  CL 107  CO2 24  GLUCOSE 92  BUN 15  CREATININE 0.76  CALCIUM 9.6  MG --  PHOS --   CBC:  Lab 02/18/11 1004  WBC 7.0  NEUTROABS 4.0  HGB 13.3  HCT 39.4  MCV 89.3  PLT 176   Urinalysis: Repeat UA 02/18/11, 17:13 Negative nitrites, negative leukocytes   Studies/Results: Dg Lumbar Spine 2-3 Views  02/18/2011  *RADIOLOGY REPORT*  Clinical Data: Lower back pain status post fall.  LUMBAR SPINE - 2-3 VIEW   Comparison: 09/30/2010 CT  Findings: Multilevel degenerative changes.  Surgical clips project over the right upper quadrant and left mid abdomen. No acute fracture or dislocation identified.  The sacrum is obscured by overlying bowel gas.  IMPRESSION: Multilevel degenerative changes.  No acute fracture or dislocation identified.  Original Report Authenticated By: Waneta Martins, M.D.   Dg Hip Complete Right  02/18/2011  *RADIOLOGY REPORT*  Clinical Data: Right hip pain status post fall.  RIGHT HIP - COMPLETE 2+ VIEW  Comparison: 06/15/2009  Findings: Diffuse osteopenia.  Advanced right hip DJD. No displaced acute fracture identified.  Enthesopathic changes along bilateral ischia and greater trochanter.  Surgical clips project over the lower abdomen.  Multilevel degenerative changes of the lumbosacral spine.  IMPRESSION: No acute displaced fracture identified. If clinical concern for a fracture persists, recommend MRI or a repeat radiograph in 5-7 days to evaluate for interval change or callus formation.  Original Report Authenticated By: Waneta Martins, M.D.   Mr Pelvis Wo Contrast  02/18/2011  *RADIOLOGY REPORT*  Clinical Data: Pelvic and hip pain, worse on the right, status post fall.  History of dementia.  MRI PELVIS WITHOUT CONTRAST  Technique:  Multi-planar multi-sequence MR imaging of the pelvis was performed following the standard protocol. No intravenous contrast was administered.  Comparison: Right hip radiographs 02/18/2011 and pelvic CT 09/30/2010.  Findings: There is mild asymmetric subcutaneous edema superiorly in the right buttock.  No focal fluid collection is identified.  There is no significant muscular edema.  The iliopsoas,  gluteus and hamstring tendons are intact bilaterally.  There is bilateral enthesopathic change at the hamstring origins on the ischial tuberosities.  A small amount of hip joint fluid is present bilaterally.  There is no evidence of proximal femur fracture.  There  is no bone marrow edema or displaced cortical fracture in the pelvis.  Lower lumbar spine degenerative changes are noted.  The sacroiliac joints appear normal.  The visualized internal pelvic contents appear unremarkable aside from moderate sigmoid colon diverticulosis.  IMPRESSION:  1.  Suspected mild subcutaneous hematoma superiorly in the right buttock. 2.  No evidence of pelvic fracture or dislocation.  Original Report Authenticated By: Gerrianne Scale, M.D.   Medications: I have reviewed the patient's current medications. Scheduled Meds:   . aspirin EC  81 mg Oral Daily  . enoxaparin (LOVENOX) injection  40 mg Subcutaneous q1800  . hydroxypropyl methylcellulose  1 drop Both Eyes Daily  . influenza  inactive virus vaccine  0.5 mL Intramuscular Once  . sertraline  50 mg Oral QHS  . sodium chloride  3 mL Intravenous Q12H   PRN Meds:.acetaminophen, traMADol, DISCONTD: ipratropium  Assessment/Plan: The patient is a 77 yo woman, history of CHF, HTN, alzheimer's disease, and R eye blindness, with a history of multiple prior falls, presenting with a fall to the right hip, with no radiologic evidence of fracture.  1. Fall - likely a mechanical fall contributed to by dementia and R eye blindness. Possibly also contributed to by a medication side effect. Unlikely cause by hypoglycemia, given normal blood glucose on admission. Patient will likely need a home environment with closer supervision to prevent future falls.  -will keep patient oriented while in-hospital, and avoid any disorienting medications  -patient will likely need increased care in her assisted living facility   2. R hip pain - s/p fall, no evidence of fracture on MRI. Will control pain conservatively, and avoid over-sedation. Patient given fentanyl in ED, with some proceeding disorientation.  -tramadol for pain, appears well-controlled today -PT/OT consult to assess functional status   3. Abnormal urinalysis - Presenting UA  showed borderline concern for UTI vs contaminated sample.  Repeat UA via in-and-out cath showed no nitrates, no leukocyte esterase. -negative for UTI, no further intervention needed  4. Dispo - likely d/c back to [facility] with an increased level of assistance -PT/OT assessment   5. Code status: Patient's family reports that she has a living will, and they will bring this document by the hospital soon. Until further notice, patient is full code.    LOS: 1 day   Janalyn Harder 02/19/2011, 9:14 AM

## 2011-02-20 DIAGNOSIS — N390 Urinary tract infection, site not specified: Secondary | ICD-10-CM | POA: Diagnosis present

## 2011-02-20 LAB — URINE CULTURE
Colony Count: NO GROWTH
Colony Count: NO GROWTH
Culture  Setup Time: 201211040111
Culture  Setup Time: 201211040111
Culture: NO GROWTH
Culture: NO GROWTH

## 2011-02-20 MED ORDER — TRAMADOL HCL 50 MG PO TABS: 50.0000 mg | ORAL_TABLET | ORAL | Status: AC | PRN

## 2011-02-20 MED ORDER — LEVOFLOXACIN 750 MG PO TABS: 750.0000 mg | ORAL_TABLET | Freq: Every day | ORAL | Status: AC

## 2011-02-20 MED ORDER — INFLUENZA VIRUS VACC SPLIT PF IM SUSP
0.5000 mL | Freq: Once | INTRAMUSCULAR | Status: DC
  Filled 2011-02-20: qty 0.5

## 2011-02-20 MED ORDER — ACETAMINOPHEN 500 MG PO TABS: 500.0000 mg | ORAL_TABLET | Freq: Two times a day (BID) | ORAL | Status: DC | PRN

## 2011-02-20 NOTE — Discharge Summary (Signed)
Internal Medicine Teaching [facility] Discharge Note  Name: Ruth [name] MRN: [id number] DOB: 05/11/1916 77 y.o.  Date of Admission: 02/18/2011  2:07 AM Date of Discharge: 02/20/2011 Attending Physician: Johny Sax, MD  Discharge Diagnosis: 1. Frequent falls - presents with an unwitnessed, probable fall to R hip, no fracture on imaging, pain well controlled with tramadol 2. Alzheimer's dementia - appears to be worsening, requiring a higher level of care 3. Urinary tract infection - reported UTI at AL facility, did not complete full treatment course, no evidence of UTI on admitting UA, but continuing full treatment course.   Discharge Medications: Current Discharge Medication List    START taking these medications   Details  levofloxacin (LEVAQUIN) 750 MG tablet Take 1 tablet (750 mg total) by mouth daily. Qty: 5 tablet, Refills: 0    traMADol (ULTRAM) 50 MG tablet Take 1 tablet (50 mg total) by mouth every 4 (four) hours as needed (pain). Maximum dose= 8 tablets per day Qty: 30 tablet, Refills: 0      CONTINUE these medications which have CHANGED   Details  acetaminophen (TYLENOL) 500 MG tablet Take 1 tablet (500 mg total) by mouth 2 (two) times daily as needed for pain. Qty: 30 tablet, Refills: 0      CONTINUE these medications which have NOT CHANGED   Details  aspirin 81 MG chewable tablet Chew 81 mg by mouth 2 (two) times daily.      Calcium Carbonate-Vitamin D (CALCIUM 600/VITAMIN D) 600-400 MG-UNIT per tablet Take 1 tablet by mouth 2 (two) times daily with a meal.      Co-Enzyme Q-10 100 MG CAPS Take 1 capsule by mouth every morning.      donepezil (ARICEPT) 10 MG tablet Take 10 mg by mouth at bedtime.      ibuprofen (ADVIL,MOTRIN) 400 MG tablet Take 400-800 mg by mouth every 8 (eight) hours as needed. For pain. Take with food.     lovastatin (MEVACOR) 20 MG tablet Take 20 mg by mouth at bedtime.      memantine (NAMENDA) 5 MG tablet Take 5 mg by mouth 2  (two) times daily.      Multiple Vitamin (MULTIVITAMIN) tablet Take 1 tablet by mouth every morning.      ramipril (ALTACE) 5 MG capsule Take 5 mg by mouth every evening.      sertraline (ZOLOFT) 50 MG tablet Take 50 mg by mouth at bedtime.      hydroxypropyl methylcellulose (ISOPTO TEARS) 2.5 % ophthalmic solution Place 1 drop into both eyes daily as needed. For dry eyes     ibandronate (BONIVA) 150 MG tablet Take 150 mg by mouth every 30 (thirty) days. Take in the morning with a full glass of water, on an empty stomach, and do not take anything else by mouth or lie down for the next 30 min.     ipratropium (ATROVENT) 0.03 % nasal spray Place 2 sprays into the nose 2 (two) times daily as needed. For runny nose     Melatonin 3 MG TABS Take 0.5 tablets by mouth at bedtime.        STOP taking these medications     gentamicin (GARAMYCIN) 40 MG/ML injection         Disposition and follow-up:   Ms.Ruth [name] was discharged to [facility] assisted living facility with an increased level of care, from [facility], in stable and improved condition, with significant improvement in right hip pain, though with continued confusion and  disorientation, likely representing progression of Alzheimer's Disease.  The patient will follow-up with her primary physician, Dr. Redmond School, at her assisted living facility.  Follow-up Appointments: Discharge Orders    Future Orders Please Complete By Expires   Diet general      Increase activity slowly      Discharge instructions      Comments:   You were admitted to the hospital for hip pain.  Scans of your hip showed no fracture.  You may take either tylenol or Tramadol, 1 tablet every 4 hours as needed for pain.  You were previously diagnosed with a Urinary Tract Infection at [facility], but did not receive a full course of treatment before admission to the hospital.  You did not have evidence of a urinary tract infection here, but it is  important to complete a full course of treatment for this condition.  Please take Levofloxacin, 1 tablet per day for 5 days to complete your course of therapy.   No wound care      Call MD for:  temperature >100.4      Call MD for:  severe uncontrolled pain      Call MD for:      Comments:   Please call your doctor if you experience another fall.      Consultations: None  Procedures Performed:  Dg Lumbar Spine 2-3 Views  02/18/2011  *RADIOLOGY REPORT*  Clinical Data: Lower back pain status post fall.  LUMBAR SPINE - 2-3 VIEW  Comparison: 09/30/2010 CT  Findings: Multilevel degenerative changes.  Surgical clips project over the right upper quadrant and left mid abdomen. No acute fracture or dislocation identified.  The sacrum is obscured by overlying bowel gas.  IMPRESSION: Multilevel degenerative changes.  No acute fracture or dislocation identified.  Original Report Authenticated By: Waneta Martins, M.D.   Dg Hip Complete Right  02/18/2011  *RADIOLOGY REPORT*  Clinical Data: Right hip pain status post fall.  RIGHT HIP - COMPLETE 2+ VIEW  Comparison: 06/15/2009  Findings: Diffuse osteopenia.  Advanced right hip DJD. No displaced acute fracture identified.  Enthesopathic changes along bilateral ischia and greater trochanter.  Surgical clips project over the lower abdomen.  Multilevel degenerative changes of the lumbosacral spine.  IMPRESSION: No acute displaced fracture identified. If clinical concern for a fracture persists, recommend MRI or a repeat radiograph in 5-7 days to evaluate for interval change or callus formation.  Original Report Authenticated By: Waneta Martins, M.D.   Mr Pelvis Wo Contrast  02/18/2011  *RADIOLOGY REPORT*  Clinical Data: Pelvic and hip pain, worse on the right, status post fall.  History of dementia.  MRI PELVIS WITHOUT CONTRAST  Technique:  Multi-planar multi-sequence MR imaging of the pelvis was performed following the standard protocol. No intravenous  contrast was administered.  Comparison: Right hip radiographs 02/18/2011 and pelvic CT 09/30/2010.  Findings: There is mild asymmetric subcutaneous edema superiorly in the right buttock.  No focal fluid collection is identified.  There is no significant muscular edema.  The iliopsoas, gluteus and hamstring tendons are intact bilaterally.  There is bilateral enthesopathic change at the hamstring origins on the ischial tuberosities.  A small amount of hip joint fluid is present bilaterally.  There is no evidence of proximal femur fracture.  There is no bone marrow edema or displaced cortical fracture in the pelvis.  Lower lumbar spine degenerative changes are noted.  The sacroiliac joints appear normal.  The visualized internal pelvic contents appear unremarkable aside from moderate  sigmoid colon diverticulosis.  IMPRESSION:  1.  Suspected mild subcutaneous hematoma superiorly in the right buttock. 2.  No evidence of pelvic fracture or dislocation.  Original Report Authenticated By: Gerrianne Scale, M.D.    Admission HPI:  The patient is a 77 yo woman, history of CHF, HTN, alzheimer's, R eye blindness, presenting s/p fall with right hip pain. Most of the history was obtained from the patient's daughter, as the patient was confused at the time of the interview. Around 1:30 a.m. on the morning of admission, the patient had an unwitnessed fall in her assisted living facility. The daughter did not get a full report of the fall, but believes the patient did not hit her head or hit any furniture, and did not lose consciousness. After the fall, the patient reported significant right hip pain, prompting her to seek medical attention. The patient has a history of several prior falls in the last year, most recently 09/30/10, prompting ED evaluation, none of which resulted in fractures. X-ray and MRI obtained in the ED showed no acute fracture at this time, but the patient continues to note significant pain. The patient  reports no other symptoms surrounding the fall, but the nursing staff at her assisted living facility reported decreased orientation during the week prior to the fall, and suspected a UTI.  Physical Exam:  T: 97.0, BP: 120/65, HR: 68, RR: 20, O2 sat: 97%  General: talkative, tangential, disoriented  HEENT: left PERRL, right eye non-reactive, oropharynx moist and non-erythematous  Neck: supple, no lymphadenopathy,no JVD Lungs: clear to ascultation bilaterally, normal work of respiration, no wheezes, rales, ronchi Heart: regular rate and rhythm, Grade III/VI systolic murmur best heard at the RUSB Abdomen: soft, non-tender, non-distended, normal bowel sounds  Extremities: R hip tender to palpation posteriorly, with no visible hematoma, laceration, or bony protrusion. no cyanosis, clubbing, or edema Neurologic: alert & oriented X3, cranial nerves II-XII grossly intact, strength grossly intact, sensation intact to light touch  Admitting labs: Na = 141, K = 3.6, Cl = 107, CO2 = 24, BUN = 15, Cr = 0.76, Glucose = 107, WBC = 7.0, Hb = 13.3, HCT = 39.4, Platelets = 176, UA: negative for nitrites, LE.  Hospital Course by problem list: 1. Frequent falls - The patient presented with a reported, unwitnessed fall at her assisted living facility, complaining of R hip pain.  MRI of the patient's hip showed no evidence of fracture of dislocation.  The patient was initially given fentanyl for pain management, which caused some confusion and agitation, and subsequently managed with tylenol and tramadol.  By discharge, the patient reported minimal right hip pain.  Of note, later during the course of hospitalization a more thorough report was obtained from the assisted living facility, detailing that the patient was actually found in her bed, screaming that she was in pain.  It is thought that the patient would be unable to get back in bed if she had actually had a fall, so this diagnosis is called into question.  The  patient does not remember any of the events that occurred.  Regardless, the patient has had several falls over the last year, and likely requires an increased level of care to prevent future falls.  2. Alzheimer's dementia - The patient has a history of Alzheimer's dementia, and was moved to an assisted living facility 1-2 months ago.  The patient's dementia appears to have worsened, with increased agitation and confusion, and with the patient requiring more assistance with  activities of daily living.  The patient was evaluated by PT/OT during hospitalization, and will be discharged back to her assisted living facility, though with an increased level of care.  3. Urinary tract infection - The patient's assisted living facility reports that the patient was diagnosed with a urinary tract infection, and received 1 dose of bactrim and 1 dose of gentamycin prior to hospital admission, though it is unclear why this antibiotic was changed.  In-hospital, UA revealed no nitrites or LE.  However, since the patient did not complete a full course of treatment for her previously-diagnosed UTI prior to admission, we will discharge the patient on antibiotics to complete a full course of treatment.  Discharge Vitals:  BP 125/65  Pulse 77  Temp(Src) 98 F (36.7 C) (Oral)  Resp 18  Ht 5\' 2"  (1.575 m)  Wt 122 lb (55.338 kg)  BMI 22.31 kg/m2  SpO2 98%  Signed: Janalyn Harder 02/20/2011, 11:05 AM

## 2011-02-20 NOTE — Progress Notes (Signed)
Assessment completed by weekend CSW. This CSW completed FL2 with Medications, faxed to ALF for approval, gathered d/c documentation and arranged PTAR transportation. Pt daughter notified. No Further CSW needs. Sign off.

## 2011-02-20 NOTE — Progress Notes (Signed)
Physical Therapy Treatment Patient Details Name: Ruth [name] MRN: [id number] DOB: 16-Oct-1916 Today's Date: 02/20/2011  PT Assessment/Plan  Cancelled visit secondary to pt discharged from hospital.   PT Goals     PT Treatment Precautions/Restrictions  Precautions Precautions: Fall Precaution Comments: pt unaware of balance deficits Restrictions Weight Bearing Restrictions: No Mobility (including Balance)      Exercise    End of Session    Deyanira Fesler 02/20/2011, 4:19 PM

## 2011-02-20 NOTE — Progress Notes (Signed)
Subjective: No acute events overnight.  The patient did not have any significant episodes of agitation or disorientation.  Patient notes no pain this morning  Objective: Vital signs in last 24 hours: Filed Vitals:   02/19/11 0629 02/19/11 1340 02/19/11 2200 02/20/11 0600  BP: 113/81 126/60 107/58 125/65  Pulse: 76 82 79 77  Temp: 97.5 F (36.4 C) 98.5 F (36.9 C) 97.8 F (36.6 C) 98 F (36.7 C)  TempSrc: Oral Oral Oral Oral  Resp: 18 19 18 18   Height:      Weight:      SpO2: 100% 94% 94% 98%   Weight change:   Intake/Output Summary (Last 24 hours) at 02/20/11 1041 Last data filed at 02/19/11 2251  Gross per 24 hour  Intake     63 ml  Output      0 ml  Net     63 ml   PEX General: alert, cooperative, and in no apparent distress HEENT: pupils equal round and reactive to light, vision grossly intact, oropharynx clear and non-erythematous  Neck: supple, no lymphadenopathy, JVD, or carotid bruits Lungs: clear to ascultation bilaterally, normal work of respiration, no wheezes, rales, ronchi Heart: regular rate and rhythm, grade III/VI systolic murmur best heart at RUSB Abdomen: soft, non-tender, non-distended, normal bowel sounds Msk: R hip non-tender to palpation, minimally tender to flexion, internal rotation, and external rotation.  No visible hematoma, laceration, or bony protrusion. Neurologic: alert & oriented X3, cranial nerves II-XII grossly intact, strength grossly intact, sensation intact to light touch  Lab Results: Basic Metabolic Panel:  Lab 02/18/11 1610  NA 141  K 3.6  CL 107  CO2 24  GLUCOSE 92  BUN 15  CREATININE 0.76  CALCIUM 9.6  MG --  PHOS --   CBC:  Lab 02/18/11 1004  WBC 7.0  NEUTROABS 4.0  HGB 13.3  HCT 39.4  MCV 89.3  PLT 176   Medications: I have reviewed the patient's current medications. Scheduled Meds:   . aspirin EC  81 mg Oral Daily  . enoxaparin (LOVENOX) injection  40 mg Subcutaneous q1800  . influenza  inactive virus  vaccine  0.5 mL Intramuscular Once  . polyvinyl alcohol  1 drop Both Eyes Daily  . sertraline  50 mg Oral QHS  . sodium chloride  3 mL Intravenous Q12H  . DISCONTD: hydroxypropyl methylcellulose  1 drop Both Eyes Daily   Continuous Infusions:  PRN Meds:.acetaminophen, haloperidol lactate, traMADol  Assessment/Plan: The patient is a 77 yo woman, history of CHF, HTN, alzheimer's disease, and R eye blindness, with a history of multiple prior falls, presenting with a possible reported fall to the right hip, with no radiologic evidence of fracture.   1. Fall - likely a mechanical fall contributed to by dementia and R eye blindness. Patient has a history of several prior falls.  After receiving full report from patient's living facility, it seems that the patient was found screaming in her bed, and may have not actually fallen, but this is unclear at this time.  Regardless, patient will need a home environment with closer supervision to prevent future falls.  -will keep patient oriented while in-hospital, and avoid any disorienting medications  -patient will likely need increased care in her assisted living facility   2. R hip pain - s/p ?fall, no evidence of fracture on MRI. Will control pain conservatively, and avoid over-sedation. -tramadol for pain, appears well-controlled today   3. Abnormal urinalysis - Presenting UA showed borderline concern  for UTI vs contaminated sample. Repeat UA via in-and-out cath showed no nitrates, no leukocyte esterase. Report from patient's nursing home reveals that she had been diagnosed with a UTI there, and given 2 days of treatment. -since patient has not received a full course of treatment for UTI, we will empirically treat for UTI  4. Dispo - likely d/c back to [facility] with an increased level of assistance     LOS: 2 days   Janalyn Harder 02/20/2011, 10:41 AM

## 2011-02-20 NOTE — Progress Notes (Signed)
  Internal Medicine Teaching Service Attending Note Date: 02/20/2011  Patient name: Ruth [name]  Medical record number: [id number]  Date of birth: 01-27-1917    This patient has been seen and discussed with the house staff. Please see their note for complete details. I concur with their findings with the following additions/corrections: Plan for d/c to SNF.   Johny Sax 02/20/2011, 10:32 AM

## 2011-05-10 ENCOUNTER — Emergency Department (HOSPITAL_COMMUNITY)
Admission: EM | Admit: 2011-05-10 | Discharge: 2011-05-10 | Disposition: A | Discharge status: Home / Self Care | Payer: Medicare Other | Attending: Emergency Medicine | Admitting: Emergency Medicine

## 2011-05-10 ENCOUNTER — Encounter (HOSPITAL_COMMUNITY): Payer: Self-pay | Admitting: *Deleted

## 2011-05-10 ENCOUNTER — Emergency Department (HOSPITAL_COMMUNITY): Payer: Medicare Other

## 2011-05-10 DIAGNOSIS — Z7982 Long term (current) use of aspirin: Secondary | ICD-10-CM | POA: Insufficient documentation

## 2011-05-10 DIAGNOSIS — R32 Unspecified urinary incontinence: Secondary | ICD-10-CM | POA: Insufficient documentation

## 2011-05-10 DIAGNOSIS — M549 Dorsalgia, unspecified: Secondary | ICD-10-CM | POA: Insufficient documentation

## 2011-05-10 DIAGNOSIS — Z9181 History of falling: Secondary | ICD-10-CM | POA: Insufficient documentation

## 2011-05-10 DIAGNOSIS — Z8673 Personal history of transient ischemic attack (TIA), and cerebral infarction without residual deficits: Secondary | ICD-10-CM | POA: Insufficient documentation

## 2011-05-10 DIAGNOSIS — F0280 Dementia in other diseases classified elsewhere without behavioral disturbance: Secondary | ICD-10-CM | POA: Insufficient documentation

## 2011-05-10 DIAGNOSIS — E785 Hyperlipidemia, unspecified: Secondary | ICD-10-CM | POA: Insufficient documentation

## 2011-05-10 DIAGNOSIS — G309 Alzheimer's disease, unspecified: Secondary | ICD-10-CM | POA: Insufficient documentation

## 2011-05-10 DIAGNOSIS — H5440 Blindness, one eye, unspecified eye: Secondary | ICD-10-CM | POA: Insufficient documentation

## 2011-05-10 DIAGNOSIS — Z79899 Other long term (current) drug therapy: Secondary | ICD-10-CM | POA: Insufficient documentation

## 2011-05-10 DIAGNOSIS — I10 Essential (primary) hypertension: Secondary | ICD-10-CM | POA: Insufficient documentation

## 2011-05-10 DIAGNOSIS — M25559 Pain in unspecified hip: Secondary | ICD-10-CM | POA: Insufficient documentation

## 2011-05-10 DIAGNOSIS — W19XXXA Unspecified fall, initial encounter: Secondary | ICD-10-CM

## 2011-05-10 DIAGNOSIS — I509 Heart failure, unspecified: Secondary | ICD-10-CM | POA: Insufficient documentation

## 2011-05-10 MED ORDER — ACETAMINOPHEN 325 MG PO TABS
650.0000 mg | ORAL_TABLET | Freq: Once | ORAL | Status: AC
  Administered 2011-05-10: 650 mg via ORAL
  Filled 2011-05-10: qty 2

## 2011-05-10 NOTE — ED Notes (Signed)
PTAR notified for pt transport to [facility].

## 2011-05-10 NOTE — ED Provider Notes (Signed)
History     CSN: [id number]  Arrival date & time 05/10/11  1453   First MD Initiated Contact with Patient 05/10/11 1507      Chief Complaint  Patient presents with  . Fall    Pt seen with medical student, I performed history/physical/documentation    Patient is a 78 y.o. female presenting with fall. The history is provided by the patient, the nursing home, the EMS personnel and a relative. The history is limited by the condition of the patient.  Fall Incident onset: just prior to arrival. Incident: transferring from commode to wheelchair. Point of impact: back. Pain location: bilaterla lower extremities. The pain is mild. Pertinent negatives include no loss of consciousness. The symptoms are aggravated by activity.   D/w nursing facility Pt fell transferring from commode to wheelchair No LOC She initially reported back pain and hip pain No other acute issues noted  Past Medical History  Diagnosis Date  . CHF (congestive heart failure)     EF 55-60% on 06/18/09  . HTN (hypertension)   . Alzheimer's dementia     significant dementia  . Stroke     History of CVA, with resulting R eye blindness  . Blindness of right eye     s/p stroke  . Aortic stenosis   . Hyperlipidemia   . Urinary incontinence     wears diapers at baseline  . Melanoma   . Frequent falls     several falls, likely mechanical    Past Surgical History  Procedure Date  . Cholecystectomy   . Partial colectomy     No family history on file.  History  Substance Use Topics  . Smoking status: Never Smoker   . Smokeless tobacco: Not on file  . Alcohol Use: No    OB History    Grav Para Term Preterm Abortions TAB SAB Ect Mult Living                  Review of Systems  Unable to perform ROS: Dementia  Neurological: Negative for loss of consciousness.    Allergies  Review of patient's allergies indicates no known allergies.  Home Medications   Current Outpatient Rx  Name Route Sig Dispense  Refill  . ACETAMINOPHEN 500 MG PO TABS Oral Take 500 mg by mouth 2 (two) times daily.    . ASPIRIN 81 MG PO CHEW Oral Chew 81 mg by mouth every morning.     . ATORVASTATIN CALCIUM 20 MG PO TABS Oral Take 20 mg by mouth every evening.    Marland Kitchen CALCIUM CARBONATE-VITAMIN D 600-400 MG-UNIT PO TABS Oral Take 1 tablet by mouth 2 (two) times daily with a meal.      . CIPROFLOXACIN HCL 250 MG PO TABS Oral Take 250 mg by mouth 2 (two) times daily. For 7 days. Started 05-04-11    . CO-ENZYME Q-10 100 MG PO CAPS Oral Take 1 capsule by mouth every morning. Hold while in hospital    . DONEPEZIL HCL 5 MG PO TABS Oral Take 5 mg by mouth at bedtime.    Marland Kitchen HYPROMELLOSE 2.5 % OP SOLN Both Eyes Place 1 drop into both eyes daily as needed. For dry eyes     . IBANDRONATE SODIUM 150 MG PO TABS Oral Take 150 mg by mouth every 30 (thirty) days. Take in the morning with a full glass of water, on an empty stomach, and do not take anything else by mouth or lie down for the  next 30 min.     . IBUPROFEN 400 MG PO TABS Oral Take 400 mg by mouth every 8 (eight) hours as needed. For pain.    Marland Kitchen ONE-DAILY MULTI VITAMINS PO TABS Oral Take 1 tablet by mouth every morning.      Marland Kitchen RAMIPRIL 5 MG PO CAPS Oral Take 5 mg by mouth daily.     . SERTRALINE HCL 50 MG PO TABS Oral Take 50 mg by mouth at bedtime.      . TRAMADOL HCL 50 MG PO TABS Oral Take 50 mg by mouth every 4 (four) hours as needed. For pain      BP 155/67  Pulse 64  Temp(Src) 96.8 F (36 C) (Oral)  Resp 24  SpO2 99%  Physical Exam CONSTITUTIONAL: Well developed/well nourished HEAD AND FACE: Normocephalic/atraumatic EYES: surgically corrected, no trauma ENMT: Mucous membranes moist NECK: supple no meningeal signs SPINE:entire spine nontender, No bruising/crepitance/stepoffs noted to spine MV:HQIONG noted LUNGS: Lungs are clear to auscultation bilaterally, no apparent distress ABDOMEN: soft, nontender, no rebound or guarding GU:no cva tenderness NEURO: Pt is  awake/alert, moves all extremitiesx4, hard of hearing, she is pleasantly demented EXTREMITIES: pulses normal, full ROM, no tenderness with ROM of either hip.  Tender to palpation of each knee and with palpation of each foot.  No deformity.   All other extremities/joints palpated/ranged and nontender SKIN: warm, color normal PSYCH: no abnormalities of mood noted  ED Course  Procedures  3:39 PM Pt had reported back pain initially, lumbar spine ordered as well as extremity films No signs of head/neck trauma   Imaging negative, pt without new complaints Patient/family comfortable with d/c home  MDM  Nursing notes reviewed and considered in documentation Previous records reviewed and considered xrays reviewed and considered        Joya Gaskins, MD 05/11/11 0028

## 2011-05-10 NOTE — ED Notes (Signed)
Attempted to contact pt family.  Called Daughter Darl Pikes at [phone] with no answer.  Message was left.  Also called son Chrissie Noa on work and cell number, Chrissie Noa not at work and cell disconnected.

## 2011-05-10 NOTE — ED Notes (Signed)
States she was getting up off the commode thought her wheelchair was locked however it wasn't and she fell landing on her bottom.  C/o lower back pain .

## 2011-10-16 ENCOUNTER — Emergency Department (HOSPITAL_COMMUNITY)
Admission: EM | Admit: 2011-10-16 | Discharge: 2011-10-16 | Disposition: A | Discharge status: Skilled Nursing Facility | Payer: Medicare Other | Attending: Emergency Medicine | Admitting: Emergency Medicine

## 2011-10-16 ENCOUNTER — Emergency Department (HOSPITAL_COMMUNITY): Payer: Medicare Other

## 2011-10-16 DIAGNOSIS — Z043 Encounter for examination and observation following other accident: Secondary | ICD-10-CM | POA: Insufficient documentation

## 2011-10-16 DIAGNOSIS — I69998 Other sequelae following unspecified cerebrovascular disease: Secondary | ICD-10-CM | POA: Insufficient documentation

## 2011-10-16 DIAGNOSIS — H539 Unspecified visual disturbance: Secondary | ICD-10-CM | POA: Insufficient documentation

## 2011-10-16 DIAGNOSIS — W19XXXA Unspecified fall, initial encounter: Secondary | ICD-10-CM

## 2011-10-16 DIAGNOSIS — F0390 Unspecified dementia without behavioral disturbance: Secondary | ICD-10-CM | POA: Insufficient documentation

## 2011-10-16 DIAGNOSIS — H5440 Blindness, one eye, unspecified eye: Secondary | ICD-10-CM | POA: Insufficient documentation

## 2011-10-16 DIAGNOSIS — I10 Essential (primary) hypertension: Secondary | ICD-10-CM | POA: Insufficient documentation

## 2011-10-16 NOTE — ED Notes (Signed)
Pt presents via [facility]. Brought from nursing home. Denies pain at this time. Complete ROM of extrms x4. Immobilized upon arrival. C collar left in place at this time. Pt awake, alert. Pleasantly demented. Daughters' x2 at bedside. Pt conversing with them. States she feel on her bottom.

## 2011-10-16 NOTE — Discharge Instructions (Signed)
RESOURCE GUIDE  Chronic Pain Problems: Contact [facility]  [phone] Patients need to be referred by their primary care doctor.  Insufficient Money for Medicine: Contact United Way:  call "211" or [facility] [phone].  No Primary Care Doctor: - Call [facility]  [phone] - can help you locate a primary care doctor that  accepts your insurance, provides certain services, etc. - Physician Referral Service- 1-800-533-3463  Agencies that provide inexpensive medical care: - [facility]  [phone] - [facility]  [phone] - [facility]  [phone] - [facility]  [phone] - Planned Parenthood  [phone] - [facility]  [phone]  Medicaid-accepting [place] Providers: - [facility]- [address]  [phone], Mon-Fri 9am-7pm, Sat 9am-1pm - [facility]- [address], [address]  [phone] - [facility]- [address], [address]  [phone] - [facility]- [address] [place] [address]  [phone] - Ve[facility]- [address], [phone]  Only accepts [place] Access Medicaid patients after they have their name  applied to their card  Self Pay (no insurance) in [place]: - Sickle Cell Patients: Ruth [name], [facility]  [address], [phone] - [facility]- [address]  [phone]       -     [facility]- [address]       -     [facility]- see information above (Speak to Pam H if you do not have insurance)       -  [facility]- [address], [phone]       -  [facility] [place]- [address],  [phone]       -  [facility]- [address] [place] [address], [phone]       -  [facility]-  [address], [place], [phone]       -  [facility]- [address], [phone]       -  [facility]- [address] [place] [address], [phone], also [address], [phone]       -    [facility]- [address], [phone], 1st & 3rd Saturday   every month, 10am-1pm  1) Find a Doctor and Pay Out of Pocket Although you won't have to find out who is covered by your insurance plan, it is a good idea to ask around and get recommendations. You will then need to call the office and see if the doctor you have chosen will accept you as a new patient and what types of options they offer for patients who are self-pay. Some doctors offer discounts or will set up payment plans for their patients who do not have insurance, but you will need to ask so you aren't surprised when you get to your appointment.  2) Contact Your Local Health Department Not all health departments have doctors that can see patients for sick visits, but many do, so it is worth a call to see if yours does. If you don't know where your local health department is, you can check in your phone book. The CDC also has a tool to help you locate your state's health department, and many state websites also have   listings of all of their local health departments.  3) Find a Walk-in Clinic If your illness is not likely to be very severe or complicated, you may want to try a walk in clinic. These are popping up all over the country in pharmacies, drugstores, and shopping centers. They're usually staffed by nurse practitioners or physician assistants that have been trained to treat common illnesses and complaints. They're usually fairly quick and inexpensive. However, if you have serious medical issues or chronic medical problems, these are probably not your best option  STD Testing - [place] [facility] [place], [facility] Clinic, [address], [place], phone [phone] or [phone].  Monday - Friday, call for an appointment. - [place]  [facility] [place], [facility] Clinic, [address], [place], phone [phone] or 1-877-539-9860.  Monday - Friday, call for an appointment.  Abuse/Neglect: - [place] Child Abuse Hotline (336) 641-3795 - [place] Child Abuse Hotline 800-378-5315 (After Hours)  Emergency Shelter:  [facility] (336) 271-5985  Maternity Homes: - [facility] (336) 275-9566 - [facility] (704) 372-4663  MRSA Hotline #:   832-7006  [place] Resources  [facility] [place]  [employer] [place] [facility] [address]                 [address]         [address]  [place]                                               [place]                              [place] Phone:  [phone]                                  Phone:  [phone]                   Phone:  [phone]  [place] [facility], [phone] - [place] Services - [facility]- 1-888-581-9988       -     [facility] in [place], [address],                                  336-349-4454, Insurance  [place] Child Abuse Hotline (336) 342-1394 or (336) 342-3537 (After Hours)   Behavioral Health Services  Substance Abuse Resources: - [facility]  336-882-2125 - [facility] 336-784-9470 - [facility] 336-285-9073 - [facility] 336-845-3988 - Residential & Outpatient Substance Abuse Program  800-659-3381  Psychological Services: -  Health  [phone] - [facility]  [phone] - [place] [facility], [address], [place], ACCESS LINE: 1-800-853-5163 or 336-641-4981, Http://www.[facility].com/services/adult.htm  Dental Assistance  If unable to pay or uninsured, contact:  [facility] or [place] [facility] to become qualified for the adult dental  clinic.  Patients with Medicaid:  [facility] [address], [phone], [phone]  If unable   to pay, or uninsured, contact [facility] 810-151-9444) or [facility] [phone] in [place], [phone] in [facility]) to become qualified for the adult dental clinic  Other Low-Cost Community Dental Services: - [facility]- [address] [place], [place], [place], [address], [phone], Ext. [phone], 2nd and 4th Thursday of the month at 6:30am.  10 clients each day by appointment, can sometimes see walk-in patients if someone does not show for an appointment. [facility]- [address] [place], [place], [address], [phone] - [facility]- [address], [place], [place], [address], [phone] [facility]- [phone] [facility] Department- [phone] [facility] [phone]     Take your usual prescriptions as previously directed.  Call your regular medical doctor today to schedule a follow up appointment this week.  Return to the Emergency Department immediately sooner if worsening.

## 2011-10-16 NOTE — ED Notes (Signed)
MD at bedside.Pt informed of d/c back to Nsg home. Daughters at bedside.

## 2011-10-16 NOTE — ED Notes (Signed)
Patient transported to X-ray 

## 2011-10-16 NOTE — ED Provider Notes (Signed)
History     CSN: [id number]  Arrival date & time 10/16/11  0907   First MD Initiated Contact with Patient 10/16/11 [phone]      Chief Complaint  Patient presents with  . Fall     Patient is a 78 y.o. female presenting with fall. The history is provided by the patient, the nursing home and a relative. History Limited By: Hx dementia.  Fall  Pt was seen at 0930.  Per pt, NH and family, c/o sudden onset and resolution of one episode of fall at the [facility].  Apparently NH staff was cleaning her room, pt was in the bathroom and "fell down."  Fall was witnessed, no syncope per NH report.  Pt told her family that her right hip hurt; denies now.  Pt with hx dementia.  Denies CP/SOB, no abd pain, no neck pain, no back pain.      Past Medical History  Diagnosis Date  . CHF (congestive heart failure)     EF 55-60% on 06/18/09  . HTN (hypertension)   . Alzheimer's dementia     significant dementia  . Stroke     History of CVA, with resulting R eye blindness  . Blindness of right eye     s/p stroke  . Aortic stenosis   . Hyperlipidemia   . Urinary incontinence     wears diapers at baseline  . Melanoma   . Frequent falls     several falls, likely mechanical    Past Surgical History  Procedure Date  . Cholecystectomy   . Partial colectomy      History  Substance Use Topics  . Smoking status: Never Smoker   . Smokeless tobacco: Not on file  . Alcohol Use: No    Review of Systems  Unable to perform ROS: Dementia    Allergies  Review of patient's allergies indicates no known allergies.  Home Medications   Current Outpatient Rx  Name Route Sig Dispense Refill  . ACETAMINOPHEN 500 MG PO TABS Oral Take 500 mg by mouth 2 (two) times daily.    . ASPIRIN 81 MG PO CHEW Oral Chew 81 mg by mouth every morning.     . ATORVASTATIN CALCIUM 20 MG PO TABS Oral Take 20 mg by mouth every evening.    Marland Kitchen CALCIUM CARBONATE-VITAMIN D 600-400 MG-UNIT PO TABS Oral Take 1 tablet by mouth 2 (two)  times daily with a meal.      . VITAMIN D 1000 UNITS PO TABS Oral Take 1,000 Units by mouth daily.    Marland Kitchen CO-ENZYME Q-10 100 MG PO CAPS Oral Take 1 capsule by mouth every morning. Hold while in hospital    . CRANBERRY 475 MG PO CAPS Oral Take 475 mg by mouth 2 (two) times daily.    . DONEPEZIL HCL 5 MG PO TABS Oral Take 5 mg by mouth at bedtime.    . IBANDRONATE SODIUM 150 MG PO TABS Oral Take 150 mg by mouth every 30 (thirty) days. Take in the morning with a full glass of water, on an empty stomach, and do not take anything else by mouth or lie down for the next 30 min.     . IBUPROFEN 400 MG PO TABS Oral Take 400 mg by mouth every 8 (eight) hours as needed. For pain.    . IPRATROPIUM BROMIDE 0.03 % NA SOLN Nasal Place 2 sprays into the nose 2 (two) times daily as needed. Runny nose    .  MICONAZOLE NITRATE 2 % EX CREA Topical Apply 1 application topically 2 (two) times daily. Apply topically to sacral area    . ONE-DAILY MULTI VITAMINS PO TABS Oral Take 1 tablet by mouth every morning.      Marland Kitchen POLYVINYL ALCOHOL 1.4 % OP SOLN Both Eyes Place 1 drop into both eyes as needed.    Marland Kitchen RAMIPRIL 5 MG PO CAPS Oral Take 5 mg by mouth daily.     . SERTRALINE HCL 50 MG PO TABS Oral Take 50 mg by mouth at bedtime.        BP 118/88  Pulse 68  Temp 98.2 F (36.8 C) (Oral)  Resp 16  Physical Exam 0935: Physical examination:  Nursing notes reviewed; Vital signs and O2 SAT reviewed;  Constitutional: Well developed, Well nourished, Well hydrated, In no acute distress; Head:  Normocephalic, atraumatic, no scalp hematomas or lacs; Eyes: EOMI, PERRL, No scleral icterus; ENMT: Mouth and pharynx normal, Mucous membranes moist; Neck: Supple, Full range of motion, No lymphadenopathy; Cardiovascular: Regular rate and rhythm, No gallop; Respiratory: Breath sounds clear & equal bilaterally, No wheezes.  Speaking full sentences with ease, Normal respiratory effort/excursion; Chest: Nontender, Movement normal; Abdomen: Soft,  Nontender, Nondistended, Normal bowel sounds; Genitourinary: No CVA tenderness; Spine:  No midline CS, TS, LS tenderness.; Extremities: Pulses normal, No tenderness, No deformity bilat UE's and LE's.  FROM major joints of bilat UE's and LE's without tenderness to palp.  Pelvis stable. No edema, No calf edema or asymmetry.; Neuro: Awake, alert, confused re: time, place, events.  +HOH, blind right eye per hx.  No facial droop.  Speech clear. Moves all ext well on stretcher and to command without apparent gross focal motor deficits.; Skin: Color normal, Warm, Dry.   ED Course  Procedures    MDM  MDM Reviewed: nursing note, vitals and previous chart Interpretation: x-ray and CT scan     Dg Chest 2 View 10/16/2011  *RADIOLOGY REPORT*  Clinical Data: Fall, congestive heart failure.  CHEST - 2 VIEW  Comparison: 09/30/2010.  Findings: Trachea is midline.  Heart is at the upper limits of normal in size.  Thoracic aorta is calcified.  Lungs are somewhat low in volume with minimal bibasilar scarring, left greater than right.  Calcified granulomas in the left upper lobe.  No pleural fluid.  Degenerative changes are seen in the spine and shoulders.  IMPRESSION: No acute findings.  Original Report Authenticated By: Reyes Ivan, M.D.   Dg Hip Complete Right 10/16/2011  *RADIOLOGY REPORT*  Clinical Data: Fall  RIGHT HIP - COMPLETE 2+ VIEW  Comparison: None.  Findings: Three views of the right hip submitted.  There is diffuse osteopenia.  No acute fracture or subluxation.  Degenerative changes are noted bilateral hip joints with narrowing of superior joint space.  Spurring noted bilateral iliac bone, bilateral superior acetabulum.  Bilateral inferior pubic ramus spurring. Spurring noted bilateral greater femoral trochanter.  Pelvic phleboliths are noted.  Degenerative changes lumbar spine.  IMPRESSION: No acute fracture or subluxation.  Degenerative changes as described above.  Bilateral superior acetabulum  spurring. Narrowing of superior joint space bilateral hip joints. Diffuse osteopenia.  Degenerative changes lumbar spine.  Original Report Authenticated By: Natasha Mead, M.D.   Ct Head Wo Contrast 10/16/2011  *RADIOLOGY REPORT*  Clinical Data: Fall  CT HEAD WITHOUT CONTRAST,CT CERVICAL SPINE WITHOUT CONTRAST  Technique:  Contiguous axial images were obtained from the base of the skull through the vertex without contrast.,Technique: Multidetector CT imaging of  the cervical spine was performed. Multiplanar CT image reconstructions were also generated.  Comparison: 07/25/2010  Findings: No skull fracture is noted.  Paranasal sinuses and mastoid air cells are unremarkable.  No intracranial hemorrhage, mass effect or midline shift.  Stable cerebral atrophy.  Periventricular and subcortical chronic white matter disease is stable from prior exam.  No acute infarction.  No mass lesion is noted on this unenhanced scan.  IMPRESSION: No acute intracranial abnormality.  Stable atrophy and chronic white matter disease.  CT cervical spine without IV contrast:  Axial images of the cervical spine shows no acute fracture or subluxation.  There is diffuse osteopenia.  Degenerative changes are noted C1-C2 articulation.  There is mild disc space flattening with anterior spurring at C2-C3 level.  Mild disc space flattening with posterior spurring noted at C3-C4 level.  Mild anterior spurring at C3-C4 level.  Mild disc space flattening with anterior spurring and minimal posterior spurring at C4-C5 level.  Moderate disc space flattening with mild anterior and mild posterior spurring at C5-C6 level.  There is mild spinal canal stenosis due to posterior spurring at C5-C6 level.  Moderate disc space flattening with mild anterior and mild posterior spurring at C6-C7 level.  Mild anterior spurring with minimal disc space flattening at C7-T1 level.  No prevertebral soft tissue swelling.  Cervical airway is patent.  Coronal images shows  atherosclerotic calcification of the left carotid bulb.  Mild facet degenerative changes noted multilevel.  Impression: 1.  No acute fracture or subluxation.  Multilevel degenerative changes as described above.  Original Report Authenticated By: Natasha Mead, M.D.   Ct Cervical Spine Wo Contrast 10/16/2011  *RADIOLOGY REPORT*  Clinical Data: Fall  CT HEAD WITHOUT CONTRAST,CT CERVICAL SPINE WITHOUT CONTRAST  Technique:  Contiguous axial images were obtained from the base of the skull through the vertex without contrast.,Technique: Multidetector CT imaging of the cervical spine was performed. Multiplanar CT image reconstructions were also generated.  Comparison: 07/25/2010  Findings: No skull fracture is noted.  Paranasal sinuses and mastoid air cells are unremarkable.  No intracranial hemorrhage, mass effect or midline shift.  Stable cerebral atrophy.  Periventricular and subcortical chronic white matter disease is stable from prior exam.  No acute infarction.  No mass lesion is noted on this unenhanced scan.  IMPRESSION: No acute intracranial abnormality.  Stable atrophy and chronic white matter disease.  CT cervical spine without IV contrast:  Axial images of the cervical spine shows no acute fracture or subluxation.  There is diffuse osteopenia.  Degenerative changes are noted C1-C2 articulation.  There is mild disc space flattening with anterior spurring at C2-C3 level.  Mild disc space flattening with posterior spurring noted at C3-C4 level.  Mild anterior spurring at C3-C4 level.  Mild disc space flattening with anterior spurring and minimal posterior spurring at C4-C5 level.  Moderate disc space flattening with mild anterior and mild posterior spurring at C5-C6 level.  There is mild spinal canal stenosis due to posterior spurring at C5-C6 level.  Moderate disc space flattening with mild anterior and mild posterior spurring at C6-C7 level.  Mild anterior spurring with minimal disc space flattening at C7-T1 level.   No prevertebral soft tissue swelling.  Cervical airway is patent.  Coronal images shows atherosclerotic calcification of the left carotid bulb.  Mild facet degenerative changes noted multilevel.  Impression: 1.  No acute fracture or subluxation.  Multilevel degenerative changes as described above.  Original Report Authenticated By: Natasha Mead, M.D.  11:10 AM:   No acute findings on CT/XR studies today.  Family would like pt sent back to the NH now.  Dx testing d/w pt's family.  Questions answered.  Verb understanding, agreeable to d/c back to Nursing Home with outpt f/u.       Laray Anger, DO 10/16/11 2035

## 2011-10-16 NOTE — ED Notes (Signed)
Pt stable. [facility] enroute to transport pt back to nsg facility.

## 2011-10-16 NOTE — ED Notes (Signed)
ZOX:WR60<AV> Expected date:10/16/11<BR> Expected time: 9:03 AM<BR> Means of arrival:Ambulance<BR> Comments:<BR> 84yoF,Fall,LSB

## 2011-10-16 NOTE — ED Notes (Signed)
Pt fell in BR,  while room was being cleaned, presents with no c/o pain. Pt states she feel in the BR and that she just slide. No s/s of fx, injury.

## 2011-10-16 NOTE — ED Notes (Signed)
MD at bedside.Dr. Barbaraann Rondo

## 2011-12-27 ENCOUNTER — Emergency Department (HOSPITAL_COMMUNITY): Payer: Medicare Other

## 2011-12-27 ENCOUNTER — Encounter (HOSPITAL_COMMUNITY): Payer: Self-pay | Admitting: Adult Health

## 2011-12-27 ENCOUNTER — Emergency Department (HOSPITAL_COMMUNITY)
Admission: EM | Admit: 2011-12-27 | Discharge: 2011-12-27 | Disposition: A | Discharge status: Home / Self Care | Payer: Medicare Other | Attending: Emergency Medicine | Admitting: Emergency Medicine

## 2011-12-27 DIAGNOSIS — S2242XA Multiple fractures of ribs, left side, initial encounter for closed fracture: Secondary | ICD-10-CM

## 2011-12-27 DIAGNOSIS — Z85820 Personal history of malignant melanoma of skin: Secondary | ICD-10-CM | POA: Insufficient documentation

## 2011-12-27 DIAGNOSIS — I10 Essential (primary) hypertension: Secondary | ICD-10-CM | POA: Insufficient documentation

## 2011-12-27 DIAGNOSIS — Y9289 Other specified places as the place of occurrence of the external cause: Secondary | ICD-10-CM | POA: Insufficient documentation

## 2011-12-27 DIAGNOSIS — S51809A Unspecified open wound of unspecified forearm, initial encounter: Secondary | ICD-10-CM | POA: Insufficient documentation

## 2011-12-27 DIAGNOSIS — H5440 Blindness, one eye, unspecified eye: Secondary | ICD-10-CM | POA: Insufficient documentation

## 2011-12-27 DIAGNOSIS — E785 Hyperlipidemia, unspecified: Secondary | ICD-10-CM | POA: Insufficient documentation

## 2011-12-27 DIAGNOSIS — Z8673 Personal history of transient ischemic attack (TIA), and cerebral infarction without residual deficits: Secondary | ICD-10-CM | POA: Insufficient documentation

## 2011-12-27 DIAGNOSIS — S2249XA Multiple fractures of ribs, unspecified side, initial encounter for closed fracture: Secondary | ICD-10-CM | POA: Insufficient documentation

## 2011-12-27 DIAGNOSIS — G309 Alzheimer's disease, unspecified: Secondary | ICD-10-CM | POA: Insufficient documentation

## 2011-12-27 DIAGNOSIS — I509 Heart failure, unspecified: Secondary | ICD-10-CM | POA: Insufficient documentation

## 2011-12-27 DIAGNOSIS — F0280 Dementia in other diseases classified elsewhere without behavioral disturbance: Secondary | ICD-10-CM | POA: Insufficient documentation

## 2011-12-27 DIAGNOSIS — IMO0002 Reserved for concepts with insufficient information to code with codable children: Secondary | ICD-10-CM | POA: Insufficient documentation

## 2011-12-27 MED ORDER — NAPROXEN 500 MG PO TABS: 500.0000 mg | ORAL_TABLET | Freq: Two times a day (BID) | ORAL | Status: DC

## 2011-12-27 MED ORDER — NAPROXEN 250 MG PO TABS
500.0000 mg | ORAL_TABLET | Freq: Once | ORAL | Status: AC
  Administered 2011-12-27: 500 mg via ORAL
  Filled 2011-12-27: qty 2

## 2011-12-27 NOTE — ED Notes (Signed)
Given inicentive spirometer and education on use to daughter and pt.

## 2011-12-27 NOTE — ED Notes (Addendum)
Lost her balance and fell un witnessed in bathroom, contusion to mid left thoracic area.  From Morning view

## 2011-12-27 NOTE — ED Provider Notes (Signed)
History     CSN: [id number]  Arrival date & time 12/27/11  0422   First MD Initiated Contact with Patient 12/27/11 0424      Chief Complaint  Patient presents with  . Fall    (Consider location/radiation/quality/duration/timing/severity/associated sxs/prior treatment) HPI Comments: 78 year old female with a history of stroke, blindness, aortic stenosis, Alzheimer's dementia, hypertension and congestive heart failure who has frequent falls presents after she fell this evening. She was found in her bathroom wedged between the toilet and the wall. She states that she was trying to get to the bathroom, when she went to sit down on the commode she missed striking her left ribs and lower back as she fell. She denies head injury or loss of consciousness. She does have a skin tear to the right forearm. Paramedics noted no significant abnormalities in route other than some pain to the left rib cage.  Onset was acute, pain is constant and worse with palpation.  Patient is a 78 y.o. female presenting with fall. The history is provided by the patient, the EMS personnel and the nursing home. History Limited By: Level 5 Caveat applies secondary to dementia.  Fall    Past Medical History  Diagnosis Date  . CHF (congestive heart failure)     EF 55-60% on 06/18/09  . HTN (hypertension)   . Alzheimer's dementia     significant dementia  . Stroke     History of CVA, with resulting R eye blindness  . Blindness of right eye     s/p stroke  . Aortic stenosis   . Hyperlipidemia   . Urinary incontinence     wears diapers at baseline  . Melanoma   . Frequent falls     several falls, likely mechanical    Past Surgical History  Procedure Date  . Cholecystectomy   . Partial colectomy     History reviewed. No pertinent family history.  History  Substance Use Topics  . Smoking status: Never Smoker   . Smokeless tobacco: Not on file  . Alcohol Use: No    OB History    Grav Para Term Preterm  Abortions TAB SAB Ect Mult Living                  Review of Systems  Unable to perform ROS: Dementia    Allergies  Percocet  Home Medications   Current Outpatient Rx  Name Route Sig Dispense Refill  . ACETAMINOPHEN 500 MG PO TABS Oral Take 500 mg by mouth 2 (two) times daily.    . ASPIRIN 81 MG PO CHEW Oral Chew 81 mg by mouth every morning.     . ATORVASTATIN CALCIUM 20 MG PO TABS Oral Take 20 mg by mouth every evening.    Marland Kitchen CALCIUM CARBONATE-VITAMIN D 600-400 MG-UNIT PO TABS Oral Take 1 tablet by mouth 2 (two) times daily with a meal.      . VITAMIN D 1000 UNITS PO TABS Oral Take 1,000 Units by mouth daily.    Marland Kitchen CO-ENZYME Q-10 100 MG PO CAPS Oral Take 1 capsule by mouth every morning. Hold while in hospital    . CRANBERRY 475 MG PO CAPS Oral Take 475 mg by mouth 2 (two) times daily.    . DONEPEZIL HCL 5 MG PO TABS Oral Take 5 mg by mouth at bedtime.    . IBANDRONATE SODIUM 150 MG PO TABS Oral Take 150 mg by mouth every 30 (thirty) days. Take in the morning with  a full glass of water, on an empty stomach, and do not take anything else by mouth or lie down for the next 30 min.Usually  Takes on the 8th    . MICONAZOLE NITRATE 2 % EX CREA Topical Apply 1 application topically 2 (two) times daily. Apply topically to sacral area    . ONE-DAILY MULTI VITAMINS PO TABS Oral Take 1 tablet by mouth every morning.      Marland Kitchen POLYVINYL ALCOHOL 1.4 % OP SOLN Both Eyes Place 1 drop into both eyes as needed.    Marland Kitchen RAMIPRIL 2.5 MG PO CAPS Oral Take 2.5 mg by mouth daily.    . SERTRALINE HCL 50 MG PO TABS Oral Take 50 mg by mouth at bedtime.      . IPRATROPIUM BROMIDE 0.03 % NA SOLN Nasal Place 2 sprays into the nose 2 (two) times daily as needed. Runny nose    . NAPROXEN 500 MG PO TABS Oral Take 1 tablet (500 mg total) by mouth 2 (two) times daily with a meal. 30 tablet 0    BP 166/65  Pulse 57  Temp 97.9 F (36.6 C) (Oral)  SpO2 98%  Physical Exam  Nursing note and vitals  reviewed. Constitutional: She appears well-developed and well-nourished. No distress.  HENT:  Head: Normocephalic and atraumatic.  Mouth/Throat: Oropharynx is clear and moist. No oropharyngeal exudate.       No hematoma, contusion, abrasion or laceration to the scalp. No malocclusion or trismus  Eyes: Conjunctivae normal and EOM are normal. Pupils are equal, round, and reactive to light. Right eye exhibits no discharge. Left eye exhibits no discharge. No scleral icterus.  Neck: Normal range of motion. Neck supple. No JVD present. No thyromegaly present.  Cardiovascular: Normal rate, regular rhythm and intact distal pulses.  Exam reveals no gallop and no friction rub.   Murmur ( Systolic murmur) heard. Pulmonary/Chest: Effort normal and breath sounds normal. No respiratory distress. She has no wheezes. She has no rales. She exhibits tenderness ( Tenderness to the left posterior lower ribs).  Abdominal: Soft. Bowel sounds are normal. She exhibits no distension and no mass. There is no tenderness.  Musculoskeletal: Normal range of motion. She exhibits no edema and no tenderness.  Lymphadenopathy:    She has no cervical adenopathy.  Neurological: She is alert. Coordination normal.  Skin: Skin is warm and dry. No rash noted. No erythema.       Superficial skin tear to the right dorsal forearm, no bleed  Psychiatric: She has a normal mood and affect. Her behavior is normal.    ED Course  Procedures (including critical care time)  Labs Reviewed - No data to display Dg Ribs Unilateral W/chest Left  12/27/2011  *RADIOLOGY REPORT*  Clinical Data: Loss of balance and fall.  Contusion to the mid left thoracic area.  LEFT RIBS AND CHEST - 3+ VIEW  Comparison: Chest 10/16/2011.  Findings: Shallow inspiration.  Borderline heart size with normal pulmonary vascularity.  Peribronchial thickening and interstitial changes suggesting chronic bronchitis.  Calcified granulomas in the left upper lung.  No focal  airspace consolidation.  No pneumothorax.  Calcified and tortuous aorta.  Degenerative changes in the spine and shoulders.  Vague linear lucencies in the left ninth, tenth, and eleventh ribs suggest nondisplaced fractures.  No focal bone lesion or bone destruction.  No expansile lesions.  IMPRESSION: Suggestion of nondisplaced fractures of the left ninth tenth and eleventh ribs.  No active consolidation in the lungs.   Original  Report Authenticated By: Marlon Pel, M.D.    Dg Lumbar Spine Complete  12/27/2011  *RADIOLOGY REPORT*  Clinical Data: Back pain after fall.  Contusion to the mid left thoracic area.  LUMBAR SPINE - COMPLETE 4+ VIEW  Comparison: 05/10/2011  Findings: Five lumbar type vertebrae.  Normal alignment of the lumbar vertebrae and facet joints.  Prominent diffuse degenerative changes throughout the lumbar spine and in the lumbar facet joints. Prominent anterior osteophytes at multiple levels, some demonstrating bridging.  Narrowed intervertebral disc spaces.  No vertebral compression deformities.  No focal bone lesion or bone destruction.  Bone cortex and trabecular architecture appear intact.  Vascular calcifications.  IMPRESSION: Diffuse degenerative change throughout the lumbar spine.  No displaced fractures identified.   Original Report Authenticated By: Marlon Pel, M.D.      1. Multiple fractures of ribs of left side   2. Skin tear       MDM  The patient is able to move all 4 extremities without any difficulty, normal grips, follows commands though she is extremely hard of hearing. Her abdomen is soft and nontender, she does have some bruising and appears to have a contusion overlying the left flank and lower ribs on the left. There is no crepitance to the ribs, lung sounds are clear and she is in no respiratory distress, imaging pending  The patient is in no distress, vital signs are reasonable for discharge, she has no respiratory difficulty, no fever, no  coughing, wheezing and treated with bacitracin and sterile dressings, pain medication given in emergency department as well as an incentive spirometer to help with her rib fracture. She appears stable for discharge to a nursing environment.  Definitive Fracture Care:  Definitive fracture care performred for the left rib fracture.  This included analgesia in the ED, incentive spiromoterly, naprosyn / pain control, which have been provided.  I have counseled the pt on possible complications of the fractures and signs and symptoms which would mandate return for further care.  Discharge Prescriptions include:  Naprosyn       Vida Roller, MD 12/27/11 [id number]

## 2012-03-21 ENCOUNTER — Emergency Department (HOSPITAL_COMMUNITY)
Admission: EM | Admit: 2012-03-21 | Discharge: 2012-03-21 | Disposition: A | Discharge status: Home / Self Care | Payer: Medicare Other | Attending: Emergency Medicine | Admitting: Emergency Medicine

## 2012-03-21 DIAGNOSIS — Z79899 Other long term (current) drug therapy: Secondary | ICD-10-CM | POA: Insufficient documentation

## 2012-03-21 DIAGNOSIS — E785 Hyperlipidemia, unspecified: Secondary | ICD-10-CM | POA: Insufficient documentation

## 2012-03-21 DIAGNOSIS — I69998 Other sequelae following unspecified cerebrovascular disease: Secondary | ICD-10-CM | POA: Insufficient documentation

## 2012-03-21 DIAGNOSIS — Z872 Personal history of diseases of the skin and subcutaneous tissue: Secondary | ICD-10-CM | POA: Insufficient documentation

## 2012-03-21 DIAGNOSIS — F0280 Dementia in other diseases classified elsewhere without behavioral disturbance: Secondary | ICD-10-CM | POA: Insufficient documentation

## 2012-03-21 DIAGNOSIS — S80811A Abrasion, right lower leg, initial encounter: Secondary | ICD-10-CM

## 2012-03-21 DIAGNOSIS — Z7982 Long term (current) use of aspirin: Secondary | ICD-10-CM | POA: Insufficient documentation

## 2012-03-21 DIAGNOSIS — Z8673 Personal history of transient ischemic attack (TIA), and cerebral infarction without residual deficits: Secondary | ICD-10-CM | POA: Insufficient documentation

## 2012-03-21 DIAGNOSIS — Y939 Activity, unspecified: Secondary | ICD-10-CM | POA: Insufficient documentation

## 2012-03-21 DIAGNOSIS — IMO0002 Reserved for concepts with insufficient information to code with codable children: Secondary | ICD-10-CM | POA: Insufficient documentation

## 2012-03-21 DIAGNOSIS — H5440 Blindness, one eye, unspecified eye: Secondary | ICD-10-CM | POA: Insufficient documentation

## 2012-03-21 DIAGNOSIS — Z8639 Personal history of other endocrine, nutritional and metabolic disease: Secondary | ICD-10-CM | POA: Insufficient documentation

## 2012-03-21 DIAGNOSIS — G309 Alzheimer's disease, unspecified: Secondary | ICD-10-CM | POA: Insufficient documentation

## 2012-03-21 DIAGNOSIS — Z789 Other specified health status: Secondary | ICD-10-CM | POA: Insufficient documentation

## 2012-03-21 DIAGNOSIS — Z9181 History of falling: Secondary | ICD-10-CM | POA: Insufficient documentation

## 2012-03-21 DIAGNOSIS — R32 Unspecified urinary incontinence: Secondary | ICD-10-CM | POA: Insufficient documentation

## 2012-03-21 DIAGNOSIS — Y9210 Unspecified residential institution as the place of occurrence of the external cause: Secondary | ICD-10-CM | POA: Insufficient documentation

## 2012-03-21 DIAGNOSIS — Z593 Problems related to living in residential institution: Secondary | ICD-10-CM | POA: Insufficient documentation

## 2012-03-21 DIAGNOSIS — H539 Unspecified visual disturbance: Secondary | ICD-10-CM | POA: Insufficient documentation

## 2012-03-21 DIAGNOSIS — I509 Heart failure, unspecified: Secondary | ICD-10-CM | POA: Insufficient documentation

## 2012-03-21 DIAGNOSIS — I10 Essential (primary) hypertension: Secondary | ICD-10-CM | POA: Insufficient documentation

## 2012-03-21 DIAGNOSIS — Z862 Personal history of diseases of the blood and blood-forming organs and certain disorders involving the immune mechanism: Secondary | ICD-10-CM | POA: Insufficient documentation

## 2012-03-21 DIAGNOSIS — Q251 Coarctation of aorta: Secondary | ICD-10-CM | POA: Insufficient documentation

## 2012-03-21 DIAGNOSIS — W19XXXA Unspecified fall, initial encounter: Secondary | ICD-10-CM | POA: Insufficient documentation

## 2012-03-21 NOTE — ED Notes (Signed)
Pt present with c/o fall from [facility].  Pt reports left hip pain, right chin skin tear.  Pt hx dementia.  Per EMS, pt fall was unwitnessed and was found by staff in right lateral recumbent position.   ? If Wheelchair fell completely on patient.  Per EMS, pt at neauro baseline upon arrival.  uknown if pt LOC or if pt hit head.  No obvious deformities noted.  Pt vitals stable: 126/68 HR 100, resp 18.

## 2012-03-21 NOTE — ED Provider Notes (Signed)
History     CSN: [id number]  Arrival date & time 03/21/12  1845   First MD Initiated Contact with Patient 03/21/12 2023      Chief Complaint  Patient presents with  . Fall    (Consider location/radiation/quality/duration/timing/severity/associated sxs/prior treatment) HPI The patient presents from her nursing facility after a fall.  Per report the patient was found in the bathroom, after a fall was heard by staff.  The patient presents with her daughter who provides the history of present illness.  The patient denies any pain, states that she is"fine". Since the fall the patient has not been ambulatory.  At baseline the patient is largely wheelchair-bound.  Per report the fall was from a standing height.  No report of loss of consciousness.  Per the daughter the patient is interacting at her baseline. Past Medical History  Diagnosis Date  . CHF (congestive heart failure)     EF 55-60% on 06/18/09  . HTN (hypertension)   . Alzheimer's dementia     significant dementia  . Stroke     History of CVA, with resulting R eye blindness  . Blindness of right eye     s/p stroke  . Aortic stenosis   . Hyperlipidemia   . Urinary incontinence     wears diapers at baseline  . Melanoma   . Frequent falls     several falls, likely mechanical    Past Surgical History  Procedure Date  . Cholecystectomy   . Partial colectomy     No family history on file.  History  Substance Use Topics  . Smoking status: Never Smoker   . Smokeless tobacco: Not on file  . Alcohol Use: No    OB History    Grav Para Term Preterm Abortions TAB SAB Ect Mult Living                  Review of Systems  Unable to perform ROS: Dementia    Allergies  Percocet  Home Medications   Current Outpatient Rx  Name  Route  Sig  Dispense  Refill  . ACETAMINOPHEN 500 MG PO TABS   Oral   Take 500 mg by mouth 2 (two) times daily.         . ASPIRIN 81 MG PO CHEW   Oral   Chew 81 mg by mouth every  morning.          . ATORVASTATIN CALCIUM 20 MG PO TABS   Oral   Take 20 mg by mouth every evening.         Marland Kitchen CALCIUM CARBONATE-VITAMIN D 600-400 MG-UNIT PO TABS   Oral   Take 1 tablet by mouth 2 (two) times daily with a meal.           . VITAMIN D 1000 UNITS PO TABS   Oral   Take 1,000 Units by mouth daily.         Marland Kitchen CO-ENZYME Q-10 100 MG PO CAPS   Oral   Take 1 capsule by mouth every morning. Hold while in hospital         . CRANBERRY 475 MG PO CAPS   Oral   Take 475 mg by mouth 2 (two) times daily.         . DONEPEZIL HCL 5 MG PO TABS   Oral   Take 5 mg by mouth at bedtime.         . IBANDRONATE SODIUM 150 MG PO TABS  Oral   Take 150 mg by mouth every 30 (thirty) days. Take in the morning with a full glass of water, on an empty stomach, and do not take anything else by mouth or lie down for the next 30 min.Usually  Takes on the 1st of every month         . IPRATROPIUM BROMIDE 0.03 % NA SOLN   Nasal   Place 2 sprays into the nose 2 (two) times daily as needed. Runny nose         . MICONAZOLE NITRATE 2 % EX CREA   Topical   Apply 1 application topically 2 (two) times daily. Apply topically to sacral area         . ONE-DAILY MULTI VITAMINS PO TABS   Oral   Take 1 tablet by mouth every morning.           Marland Kitchen POLYVINYL ALCOHOL 1.4 % OP SOLN   Both Eyes   Place 1 drop into both eyes as needed. Dry eyes         . RAMIPRIL 2.5 MG PO CAPS   Oral   Take 2.5 mg by mouth daily.         . SERTRALINE HCL 50 MG PO TABS   Oral   Take 50 mg by mouth at bedtime.             There were no vitals taken for this visit.  Physical Exam  Nursing note and vitals reviewed. Constitutional: She appears well-developed and well-nourished. No distress. Cervical collar in place.  HENT:  Head: Normocephalic and atraumatic.  Eyes: Conjunctivae normal are normal. Right eye exhibits no discharge. Left eye exhibits no discharge.  Neck: No tracheal deviation  present.  Cardiovascular: Normal rate and regular rhythm.   Murmur heard. Pulmonary/Chest: Effort normal. No stridor. No respiratory distress.  Abdominal: Soft. She exhibits no distension.  Musculoskeletal:       Legs:      The patient has no tenderness to palpation about the hips, knees, ankles.  During the exam she is moving freely, lifting both legs without apparent pain.  Neurological: She is alert. A cranial nerve deficit is present. She exhibits normal muscle tone. Coordination and gait normal.       The patient has extremely poor hearing, otherwise no cranial nerve deficits, she distally neurovascularly intact    ED Course  Procedures (including critical care time)  Labs Reviewed - No data to display No results found.   1. Fall   2. Abrasion of right leg       MDM  This patient presents after a fall that occurred at a nursing facility.  On my exam the patient states that she is fine and has no tenderness to palpation of any of her joints, is moving her legs freely.  She does have an abrasion.  I discussed the indication for x-rays with the patient's daughter.  Absent distress, with free motion of all extremities, with no pain on palpation, there is no indication for these studies.  Return precautions, including development of the pain were discussed, agreed upon, and the patient was discharged in stable condition after her wounds were dressed.     Gerhard Munch, MD 03/21/12 2114

## 2012-03-21 NOTE — ED Notes (Signed)
Waiting transport to ECF by [facility]

## 2012-09-08 ENCOUNTER — Encounter (HOSPITAL_COMMUNITY): Payer: Self-pay

## 2012-09-08 ENCOUNTER — Emergency Department (HOSPITAL_COMMUNITY): Payer: Medicare Other

## 2012-09-08 ENCOUNTER — Emergency Department (HOSPITAL_COMMUNITY)
Admission: EM | Admit: 2012-09-08 | Discharge: 2012-09-08 | Disposition: A | Discharge status: Home / Self Care | Payer: Medicare Other | Attending: Emergency Medicine | Admitting: Emergency Medicine

## 2012-09-08 DIAGNOSIS — I69998 Other sequelae following unspecified cerebrovascular disease: Secondary | ICD-10-CM | POA: Insufficient documentation

## 2012-09-08 DIAGNOSIS — E785 Hyperlipidemia, unspecified: Secondary | ICD-10-CM | POA: Insufficient documentation

## 2012-09-08 DIAGNOSIS — H539 Unspecified visual disturbance: Secondary | ICD-10-CM | POA: Insufficient documentation

## 2012-09-08 DIAGNOSIS — Z7982 Long term (current) use of aspirin: Secondary | ICD-10-CM | POA: Insufficient documentation

## 2012-09-08 DIAGNOSIS — Z9181 History of falling: Secondary | ICD-10-CM | POA: Insufficient documentation

## 2012-09-08 DIAGNOSIS — W19XXXA Unspecified fall, initial encounter: Secondary | ICD-10-CM

## 2012-09-08 DIAGNOSIS — H5440 Blindness, one eye, unspecified eye: Secondary | ICD-10-CM | POA: Insufficient documentation

## 2012-09-08 DIAGNOSIS — Z79899 Other long term (current) drug therapy: Secondary | ICD-10-CM | POA: Insufficient documentation

## 2012-09-08 DIAGNOSIS — M545 Low back pain, unspecified: Secondary | ICD-10-CM | POA: Insufficient documentation

## 2012-09-08 DIAGNOSIS — T148XXA Other injury of unspecified body region, initial encounter: Secondary | ICD-10-CM

## 2012-09-08 DIAGNOSIS — R011 Cardiac murmur, unspecified: Secondary | ICD-10-CM | POA: Insufficient documentation

## 2012-09-08 DIAGNOSIS — C439 Malignant melanoma of skin, unspecified: Secondary | ICD-10-CM | POA: Insufficient documentation

## 2012-09-08 DIAGNOSIS — N390 Urinary tract infection, site not specified: Secondary | ICD-10-CM

## 2012-09-08 DIAGNOSIS — I359 Nonrheumatic aortic valve disorder, unspecified: Secondary | ICD-10-CM | POA: Insufficient documentation

## 2012-09-08 DIAGNOSIS — I509 Heart failure, unspecified: Secondary | ICD-10-CM | POA: Insufficient documentation

## 2012-09-08 DIAGNOSIS — S0990XA Unspecified injury of head, initial encounter: Secondary | ICD-10-CM | POA: Insufficient documentation

## 2012-09-08 DIAGNOSIS — W010XXA Fall on same level from slipping, tripping and stumbling without subsequent striking against object, initial encounter: Secondary | ICD-10-CM | POA: Insufficient documentation

## 2012-09-08 DIAGNOSIS — R32 Unspecified urinary incontinence: Secondary | ICD-10-CM | POA: Insufficient documentation

## 2012-09-08 DIAGNOSIS — I10 Essential (primary) hypertension: Secondary | ICD-10-CM | POA: Insufficient documentation

## 2012-09-08 DIAGNOSIS — F0280 Dementia in other diseases classified elsewhere without behavioral disturbance: Secondary | ICD-10-CM | POA: Insufficient documentation

## 2012-09-08 DIAGNOSIS — S8010XA Contusion of unspecified lower leg, initial encounter: Secondary | ICD-10-CM | POA: Insufficient documentation

## 2012-09-08 DIAGNOSIS — Y9210 Unspecified residential institution as the place of occurrence of the external cause: Secondary | ICD-10-CM | POA: Insufficient documentation

## 2012-09-08 DIAGNOSIS — Y93E1 Activity, personal bathing and showering: Secondary | ICD-10-CM | POA: Insufficient documentation

## 2012-09-08 DIAGNOSIS — G309 Alzheimer's disease, unspecified: Secondary | ICD-10-CM | POA: Insufficient documentation

## 2012-09-08 LAB — URINALYSIS, ROUTINE W REFLEX MICROSCOPIC
Glucose, UA: NEGATIVE mg/dL
Protein, ur: NEGATIVE mg/dL
Specific Gravity, Urine: 1.014 (ref 1.005–1.030)

## 2012-09-08 LAB — URINE MICROSCOPIC-ADD ON

## 2012-09-08 LAB — CBC WITH DIFFERENTIAL/PLATELET
Eosinophils Absolute: 0.6 10*3/uL (ref 0.0–0.7)
Hemoglobin: 12.6 g/dL (ref 12.0–15.0)
Lymphs Abs: 1.3 10*3/uL (ref 0.7–4.0)
MCH: 29 pg (ref 26.0–34.0)
MCV: 88 fL (ref 78.0–100.0)
Monocytes Relative: 10 % (ref 3–12)
Neutrophils Relative %: 62 % (ref 43–77)
RBC: 4.34 MIL/uL (ref 3.87–5.11)

## 2012-09-08 LAB — BASIC METABOLIC PANEL
BUN: 22 mg/dL (ref 6–23)
GFR calc non Af Amer: 73 mL/min — ABNORMAL LOW (ref >90)
Glucose, Bld: 134 mg/dL — ABNORMAL HIGH (ref 70–99)
Potassium: 3.6 mEq/L (ref 3.5–5.1)

## 2012-09-08 MED ORDER — SODIUM CHLORIDE 0.9 % IV BOLUS (SEPSIS)
1000.0000 mL | Freq: Once | INTRAVENOUS | Status: AC
  Administered 2012-09-08: 1000 mL via INTRAVENOUS

## 2012-09-08 MED ORDER — DEXTROSE 5 % IV SOLN
1.0000 g | Freq: Once | INTRAVENOUS | Status: AC
  Administered 2012-09-08: 1 g via INTRAVENOUS
  Filled 2012-09-08: qty 10

## 2012-09-08 MED ORDER — CEPHALEXIN 500 MG PO CAPS: 500.0000 mg | ORAL_CAPSULE | Freq: Two times a day (BID) | ORAL | Status: DC

## 2012-09-08 MED ORDER — CEFTRIAXONE SODIUM 1 G IJ SOLR
1.0000 g | Freq: Once | INTRAMUSCULAR | Status: DC
  Filled 2012-09-08: qty 10

## 2012-09-08 MED ORDER — CEPHALEXIN 500 MG PO CAPS
500.0000 mg | ORAL_CAPSULE | Freq: Once | ORAL | Status: DC
  Filled 2012-09-08: qty 1

## 2012-09-08 NOTE — ED Notes (Signed)
Per EMS- Patient had an unwitnessed fall in the shower. Patient has a skin tear to right lower leg. Patient also c/o neck and back pain.

## 2012-09-08 NOTE — ED Notes (Signed)
Pt unable to ambulate.  Per family, wheelchair bound.

## 2012-09-08 NOTE — ED Notes (Signed)
[facility] notified.  ?

## 2012-09-08 NOTE — ED Provider Notes (Addendum)
History     CSN: [id number]  Arrival date & time 09/08/12  1544   First MD Initiated Contact with Patient 09/08/12 1548      Chief Complaint  Patient presents with  . Fall    (Consider location/radiation/quality/duration/timing/severity/associated sxs/prior treatment) HPI Comments: Pt comes in with cc of fall. Per Nursing home - patient fell in the shower tub, un witnessed. There is hx of falls. Pt really cant tell me how she fell .She has no nausea, vomiting, visual complains, seizures. Pt is not on any anticoagulants. Pt reports pain in her back, back of her head and right leg.   Patient is a 79 y.o. female presenting with fall. The history is provided by the patient, medical records and the nursing home.  Fall Associated symptoms include headaches. Pertinent negatives include no chest pain and no shortness of breath.    Past Medical History  Diagnosis Date  . CHF (congestive heart failure)     EF 55-60% on 06/18/09  . HTN (hypertension)   . Alzheimer's dementia     significant dementia  . Stroke     History of CVA, with resulting R eye blindness  . Blindness of right eye     s/p stroke  . Aortic stenosis   . Hyperlipidemia   . Urinary incontinence     wears diapers at baseline  . Melanoma   . Frequent falls     several falls, likely mechanical    Past Surgical History  Procedure Laterality Date  . Cholecystectomy    . Partial colectomy      History reviewed. No pertinent family history.  History  Substance Use Topics  . Smoking status: Never Smoker   . Smokeless tobacco: Not on file  . Alcohol Use: No    OB History   Grav Para Term Preterm Abortions TAB SAB Ect Mult Living                  Review of Systems  Respiratory: Negative for shortness of breath.   Cardiovascular: Negative for chest pain.  Gastrointestinal: Positive for nausea and vomiting.  Musculoskeletal: Positive for back pain and arthralgias.  Skin: Positive for wound.   Neurological: Positive for headaches. Negative for light-headedness.  Hematological: Does not bruise/bleed easily.    Allergies  Percocet  Home Medications   Current Outpatient Rx  Name  Route  Sig  Dispense  Refill  . acetaminophen (TYLENOL) 500 MG tablet   Oral   Take 500 mg by mouth 2 (two) times daily.         Marland Kitchen aspirin 81 MG chewable tablet   Oral   Chew 81 mg by mouth every morning.          Marland Kitchen atorvastatin (LIPITOR) 20 MG tablet   Oral   Take 20 mg by mouth every evening.         . Calcium Carbonate-Vitamin D (CALCIUM 600/VITAMIN D) 600-400 MG-UNIT per tablet   Oral   Take 1 tablet by mouth 2 (two) times daily with a meal.           . cholecalciferol (VITAMIN D) 1000 UNITS tablet   Oral   Take 1,000 Units by mouth daily.         Marland Kitchen Co-Enzyme Q-10 100 MG CAPS   Oral   Take 1 capsule by mouth every morning. Hold while in hospital         . Cranberry 475 MG CAPS   Oral  Take 475 mg by mouth 2 (two) times daily.         Marland Kitchen donepezil (ARICEPT) 5 MG tablet   Oral   Take 5 mg by mouth at bedtime.         . ibandronate (BONIVA) 150 MG tablet   Oral   Take 150 mg by mouth every 30 (thirty) days. Take in the morning with a full glass of water, on an empty stomach, and do not take anything else by mouth or lie down for the next 30 min.Usually  Takes on the 1st of every month         . ipratropium (ATROVENT) 0.03 % nasal spray   Nasal   Place 2 sprays into the nose 2 (two) times daily as needed. Runny nose         . miconazole (BAZA ANTIFUNGAL) 2 % cream   Topical   Apply 1 application topically 2 (two) times daily. Apply topically to sacral area         . Multiple Vitamin (MULTIVITAMIN) tablet   Oral   Take 1 tablet by mouth every morning.           . polyvinyl alcohol (LIQUIFILM TEARS) 1.4 % ophthalmic solution   Both Eyes   Place 1 drop into both eyes as needed. Dry eyes         . ramipril (ALTACE) 2.5 MG capsule   Oral   Take  2.5 mg by mouth daily.         . sertraline (ZOLOFT) 50 MG tablet   Oral   Take 50 mg by mouth at bedtime.             BP 149/56  Pulse 65  Temp(Src) 98.4 F (36.9 C) (Oral)  Resp 14  SpO2 97%  Physical Exam  Nursing note and vitals reviewed. Constitutional: She is oriented to person, place, and time. She appears well-developed and well-nourished.  HENT:  Head: Normocephalic and atraumatic.  No midline c-spine tenderness, pt able to turn head to 45 degrees bilaterally without any pain and able to flex neck to the chest and extend without any pain or neurologic symptoms.    Eyes: EOM are normal. Pupils are equal, round, and reactive to light.  Neck: Neck supple. No JVD present.  Cardiovascular: Normal rate and regular rhythm.   Murmur heard. Pulmonary/Chest: Effort normal. No respiratory distress.  Abdominal: Soft. She exhibits no distension. There is no tenderness. There is no rebound and no guarding.  Musculoskeletal:  RIGHT TIBIA - THERE IS HEMATOMA, AND SKIN SLOUGHING, NO DEEP LACERATION. LOWER LUMBAR TENDERNESS, NO STEP OFFS BILATERAL HIP TENDERNESS  Other than that: Head to toe evaluation shows no hematoma, bleeding of the scalp, no facial abrasions, step offs, crepitus, no tenderness to palpation of the bilateral upper and lower extremities, no gross deformities, no chest tenderness.  Neurological: She is alert and oriented to person, place, and time.  Skin: Skin is warm and dry.    ED Course  Procedures (including critical care time)  Labs Reviewed  CBC WITH DIFFERENTIAL - Abnormal; Notable for the following:    Eosinophils Relative 9 (*)    All other components within normal limits  BASIC METABOLIC PANEL  TROPONIN I  URINALYSIS, ROUTINE W REFLEX MICROSCOPIC   No results found.   No diagnosis found.    MDM  DDx includes: - Mechanical falls - ICH - Fractures - Contusions - Soft tissue injury   Pt comes in with  cc of fall. Pt has several  areas of tenderness on exam - and we will get appropriate imaging. Given her dementia, and several areas of pain, we will get Ct head and CT spine to clear the head and spine.  Derwood Kaplan, MD 09/08/12 1711  7:51 PM All imaging is negative. We will ambulate. UA is positive - family refusing ivab, so will give keflex and d.c  Derwood Kaplan, MD 09/08/12 1952

## 2012-09-08 NOTE — Discharge Instructions (Signed)
We saw you in the ER after you had a fall.  All the imaging results are normal, no fractures seen. No evidence of brain bleed. You do have a urinary tract infection - and we are sending you home with antibiotics for that. Please be very careful with walking, and do everything possible to prevent falls.   Contusion A contusion is a deep bruise. Contusions are the result of an injury that caused bleeding under the skin. The contusion may turn blue, purple, or yellow. Minor injuries will give you a painless contusion, but more severe contusions may stay painful and swollen for a few weeks.  CAUSES  A contusion is usually caused by a blow, trauma, or direct force to an area of the body. SYMPTOMS   Swelling and redness of the injured area.  Bruising of the injured area.  Tenderness and soreness of the injured area.  Pain. DIAGNOSIS  The diagnosis can be made by taking a history and physical exam. An X-ray, CT scan, or MRI may be needed to determine if there were any associated injuries, such as fractures. TREATMENT  Specific treatment will depend on what area of the body was injured. In general, the best treatment for a contusion is resting, icing, elevating, and applying cold compresses to the injured area. Over-the-counter medicines may also be recommended for pain control. Ask your caregiver what the best treatment is for your contusion. HOME CARE INSTRUCTIONS   Put ice on the injured area.  Put ice in a plastic bag.  Place a towel between your skin and the bag.  Leave the ice on for 15-20 minutes, 3-4 times a day.  Only take over-the-counter or prescription medicines for pain, discomfort, or fever as directed by your caregiver. Your caregiver may recommend avoiding anti-inflammatory medicines (aspirin, ibuprofen, and naproxen) for 48 hours because these medicines may increase bruising.  Rest the injured area.  If possible, elevate the injured area to reduce swelling. SEEK  IMMEDIATE MEDICAL CARE IF:   You have increased bruising or swelling.  You have pain that is getting worse.  Your swelling or pain is not relieved with medicines. MAKE SURE YOU:   Understand these instructions.  Will watch your condition.  Will get help right away if you are not doing well or get worse. Document Released: 01/11/2005 Document Revised: 06/26/2011 Document Reviewed: 02/06/2011 [facility] Patient Information 2014 [place], [place].  Urinary Tract Infection Urinary tract infections (UTIs) can develop anywhere along your urinary tract. Your urinary tract is your body's drainage system for removing wastes and extra water. Your urinary tract includes two kidneys, two ureters, a bladder, and a urethra. Your kidneys are a pair of bean-shaped organs. Each kidney is about the size of your fist. They are located below your ribs, one on each side of your spine. CAUSES Infections are caused by microbes, which are microscopic organisms, including fungi, viruses, and bacteria. These organisms are so small that they can only be seen through a microscope. Bacteria are the microbes that most commonly cause UTIs. SYMPTOMS  Symptoms of UTIs may vary by age and gender of the patient and by the location of the infection. Symptoms in young women typically include a frequent and intense urge to urinate and a painful, burning feeling in the bladder or urethra during urination. Older women and men are more likely to be tired, shaky, and weak and have muscle aches and abdominal pain. A fever may mean the infection is in your kidneys. Other symptoms of a  kidney infection include pain in your back or sides below the ribs, nausea, and vomiting. DIAGNOSIS To diagnose a UTI, your caregiver will ask you about your symptoms. Your caregiver also will ask to provide a urine sample. The urine sample will be tested for bacteria and white blood cells. White blood cells are made by your body to help fight  infection. TREATMENT  Typically, UTIs can be treated with medication. Because most UTIs are caused by a bacterial infection, they usually can be treated with the use of antibiotics. The choice of antibiotic and length of treatment depend on your symptoms and the type of bacteria causing your infection. HOME CARE INSTRUCTIONS  If you were prescribed antibiotics, take them exactly as your caregiver instructs you. Finish the medication even if you feel better after you have only taken some of the medication.  Drink enough water and fluids to keep your urine clear or pale yellow.  Avoid caffeine, tea, and carbonated beverages. They tend to irritate your bladder.  Empty your bladder often. Avoid holding urine for long periods of time.  Empty your bladder before and after sexual intercourse.  After a bowel movement, women should cleanse from front to back. Use each tissue only once. SEEK MEDICAL CARE IF:   You have back pain.  You develop a fever.  Your symptoms do not begin to resolve within 3 days. SEEK IMMEDIATE MEDICAL CARE IF:   You have severe back pain or lower abdominal pain.  You develop chills.  You have nausea or vomiting.  You have continued burning or discomfort with urination. MAKE SURE YOU:   Understand these instructions.  Will watch your condition.  Will get help right away if you are not doing well or get worse. Document Released: 01/11/2005 Document Revised: 10/03/2011 Document Reviewed: 05/12/2011 [facility] Patient Information 2014 [place], [place].

## 2012-09-08 NOTE — ED Notes (Signed)
[id number]<AV> Expected date:<BR> Expected time:<BR> Means of arrival:<BR> Comments:<BR> Elderly, fall

## 2012-09-08 NOTE — ED Notes (Signed)
She is quite h.o.h.  In the presence of her daughter she tells [place] she fell, but can't remember how.  EMS were told by nursing home staff that pt. Slipped and fell in shower.  She has skin tear with underlying edema at right mid shin area.

## 2012-09-11 LAB — URINE CULTURE: Colony Count: >=100000

## 2012-09-12 NOTE — ED Notes (Signed)
Post ED Visit - Positive Culture Follow-up  Culture report reviewed by antimicrobial stewardship pharmacist: []  Wes Dulaney, Pharm.D., BCPS []  Celedonio Miyamoto, Pharm.D., BCPS []  Georgina Pillion, [address].D., BCPS [x]  [place], [address].D., BCPS, AAHIVP []  Estella Husk, Pharm.D., BCPS, AAHIV  Positive urine culture Treated with Cephalexin, organism sensitive to the same and no further patient follow-up is required at this time.  Larena Sox 09/12/2012, 4:22 PM

## 2012-11-27 ENCOUNTER — Emergency Department (HOSPITAL_COMMUNITY)
Admission: EM | Admit: 2012-11-27 | Discharge: 2012-11-27 | Disposition: A | Discharge status: Home / Self Care | Payer: Medicare Other | Attending: Emergency Medicine | Admitting: Emergency Medicine

## 2012-11-27 ENCOUNTER — Encounter (HOSPITAL_COMMUNITY): Payer: Self-pay | Admitting: Emergency Medicine

## 2012-11-27 ENCOUNTER — Emergency Department (HOSPITAL_COMMUNITY): Payer: Medicare Other

## 2012-11-27 DIAGNOSIS — W19XXXA Unspecified fall, initial encounter: Secondary | ICD-10-CM

## 2012-11-27 DIAGNOSIS — I10 Essential (primary) hypertension: Secondary | ICD-10-CM | POA: Insufficient documentation

## 2012-11-27 DIAGNOSIS — Z85820 Personal history of malignant melanoma of skin: Secondary | ICD-10-CM | POA: Insufficient documentation

## 2012-11-27 DIAGNOSIS — F0280 Dementia in other diseases classified elsewhere without behavioral disturbance: Secondary | ICD-10-CM | POA: Insufficient documentation

## 2012-11-27 DIAGNOSIS — Y92129 Unspecified place in nursing home as the place of occurrence of the external cause: Secondary | ICD-10-CM

## 2012-11-27 DIAGNOSIS — I509 Heart failure, unspecified: Secondary | ICD-10-CM | POA: Insufficient documentation

## 2012-11-27 DIAGNOSIS — Z7982 Long term (current) use of aspirin: Secondary | ICD-10-CM | POA: Insufficient documentation

## 2012-11-27 DIAGNOSIS — E785 Hyperlipidemia, unspecified: Secondary | ICD-10-CM | POA: Insufficient documentation

## 2012-11-27 DIAGNOSIS — M542 Cervicalgia: Secondary | ICD-10-CM | POA: Insufficient documentation

## 2012-11-27 DIAGNOSIS — R011 Cardiac murmur, unspecified: Secondary | ICD-10-CM | POA: Insufficient documentation

## 2012-11-27 DIAGNOSIS — R296 Repeated falls: Secondary | ICD-10-CM | POA: Insufficient documentation

## 2012-11-27 DIAGNOSIS — S79919A Unspecified injury of unspecified hip, initial encounter: Secondary | ICD-10-CM | POA: Insufficient documentation

## 2012-11-27 DIAGNOSIS — Y9210 Unspecified residential institution as the place of occurrence of the external cause: Secondary | ICD-10-CM | POA: Insufficient documentation

## 2012-11-27 DIAGNOSIS — Z8673 Personal history of transient ischemic attack (TIA), and cerebral infarction without residual deficits: Secondary | ICD-10-CM | POA: Insufficient documentation

## 2012-11-27 DIAGNOSIS — Y939 Activity, unspecified: Secondary | ICD-10-CM | POA: Insufficient documentation

## 2012-11-27 DIAGNOSIS — Z9181 History of falling: Secondary | ICD-10-CM | POA: Insufficient documentation

## 2012-11-27 DIAGNOSIS — Z8679 Personal history of other diseases of the circulatory system: Secondary | ICD-10-CM | POA: Insufficient documentation

## 2012-11-27 DIAGNOSIS — G309 Alzheimer's disease, unspecified: Secondary | ICD-10-CM | POA: Insufficient documentation

## 2012-11-27 DIAGNOSIS — Z79899 Other long term (current) drug therapy: Secondary | ICD-10-CM | POA: Insufficient documentation

## 2012-11-27 NOTE — ED Notes (Signed)
Voided on bedpan, approx 80 ml golden urine.  To xray for films.

## 2012-11-27 NOTE — ED Notes (Signed)
Returned from [employer]. No complaints.

## 2012-11-27 NOTE — ED Notes (Signed)
Pt examined head to toe, no complaints of pain.  Daughter now at bedside.

## 2012-11-27 NOTE — ED Provider Notes (Signed)
CSN: [id number]     Arrival date & time 11/27/12  1650 History     First MD Initiated Contact with Patient 11/27/12 1654     Chief Complaint  Patient presents with  . Fall   (Consider location/radiation/quality/duration/timing/severity/associated sxs/prior Treatment) Patient is a 79 y.o. female presenting with fall.  Fall   Diamond Jentz is a 79 year old female with PMH of Alzheimer's dementia and frequent falls.  She was brought into [facility] by EMS after an un witnessed fall at [facility].  EMS reported that patient complained of some neck pain on palpation and also hair pain and placed patient in C-collar in back brace prior to arrival.  Patient denies any current pain but reports that it feels like she is "sitting on rocks."  Patient denies any recollection of fall.  Patient's daughter is at bedside and reports she was with her mother earlier today and she left her in her recliner, by the time she got home she was called by NH to come to [facility].  She reports that her mother is at her baseline and does not notice anything usual about her.  Patient takes an 81 mg ASA but is not on anticoagulation. Spoke to [facility] nurse who cared for patient, reports patient was found at foot of bed on the floor, un witnessed fall, at that time she did complain of some left side hip pain.  Reports patient has history of frequent falls and has significant baseline dementia (only orientated to person)  Past Medical History  Diagnosis Date  . CHF (congestive heart failure)     EF 55-60% on 06/18/09  . HTN (hypertension)   . Alzheimer's dementia     significant dementia  . Stroke     History of CVA, with resulting R eye blindness  . Blindness of right eye     s/p stroke  . Aortic stenosis   . Hyperlipidemia   . Urinary incontinence     wears diapers at baseline  . Melanoma   . Frequent falls     several falls, likely mechanical   Past Surgical History  Procedure Laterality Date  .  Cholecystectomy    . Partial colectomy     History reviewed. No pertinent family history. History  Substance Use Topics  . Smoking status: Never Smoker   . Smokeless tobacco: Not on file  . Alcohol Use: No   OB History   Grav Para Term Preterm Abortions TAB SAB Ect Mult Living                 Review of Systems  Unable to perform ROS: Dementia    Allergies  Percocet  Home Medications   Current Outpatient Rx  Name  Route  Sig  Dispense  Refill  . acetaminophen (TYLENOL) 500 MG tablet   Oral   Take 500 mg by mouth 2 (two) times daily.         Marland Kitchen aspirin 81 MG chewable tablet   Oral   Chew 81 mg by mouth every morning.          Marland Kitchen atorvastatin (LIPITOR) 20 MG tablet   Oral   Take 20 mg by mouth every evening.         . bacitracin 500 UNIT/GM ointment   Topical   Apply 1 application topically 3 (three) times daily.         . Calcium Carbonate-Vitamin D (CALCIUM-D) 600-400 MG-UNIT TABS   Oral  Take 1 tablet by mouth daily.         . cholecalciferol (VITAMIN D) 1000 UNITS tablet   Oral   Take 1,000 Units by mouth daily.         Marland Kitchen Co-Enzyme Q-10 100 MG CAPS   Oral   Take 1 capsule by mouth every morning. Hold while in hospital         . Cranberry 475 MG CAPS   Oral   Take 475 mg by mouth 2 (two) times daily.         Marland Kitchen donepezil (ARICEPT) 5 MG tablet   Oral   Take 5 mg by mouth at bedtime.         . miconazole (BAZA ANTIFUNGAL) 2 % cream   Topical   Apply 1 application topically 2 (two) times daily. Apply topically to sacral area         . Multiple Vitamins-Minerals (CERTAVITE/ANTIOXIDANTS) TABS   Oral   Take 1 tablet by mouth daily.         . polyvinyl alcohol (LIQUIFILM TEARS) 1.4 % ophthalmic solution   Both Eyes   Place 1 drop into both eyes as needed. Dry eyes         . ramipril (ALTACE) 2.5 MG capsule   Oral   Take 2.5 mg by mouth daily.         . sertraline (ZOLOFT) 50 MG tablet   Oral   Take 50 mg by mouth at  bedtime.           . Skin Protectants, Misc. (EUCERIN) cream   Topical   Apply 1 application topically 2 (two) times daily. *to arms and legs          BP 182/71  Pulse 70  SpO2 97% Physical Exam  Constitutional: She appears well-developed and well-nourished. No distress.  HENT:  Head: Normocephalic and atraumatic.  No hematomas or tenderness of face or scalp  Eyes: Conjunctivae and EOM are normal.  Blind in right eye.  Neck: Normal range of motion. Neck supple.  C spine not tender to palpation.  Cardiovascular: Normal rate and regular rhythm.   Murmur (SEM) heard. Pulmonary/Chest: Effort normal and breath sounds normal. No respiratory distress. She has no wheezes. She has no rales.  Abdominal: Soft. Bowel sounds are normal. She exhibits no distension. There is no tenderness. There is no rebound.  Musculoskeletal:  thorsaic and lumbar spine not tender to palpation, no tenderness of sacrum.  No tenderness of pelvis, femur, knee, lower leg, or feet b/l.  Neurological: She is alert.  Oriented to person only, at baseline per daughter and nursing home  Skin: She is not diaphoretic.  No hematomas appreciated.    ED Course   Procedures (including critical care time)  Labs Reviewed - No data to display Dg Pelvis 1-2 Views  11/27/2012   *RADIOLOGY REPORT*  Clinical Data: Fall, posterior left hip pain  PELVIS - 1-2 VIEW  Comparison: 09/08/2012  Findings: Mild bilateral hip degenerative change and lumbar spine degenerative change noted.  Sacroiliac joints are normal.  No displaced pelvic fracture.  Normal visualized bowel gas pattern.  IMPRESSION: No displaced pelvic fracture.   Original Report Authenticated By: Christiana Pellant, M.D.   1. Fall at nursing home, initial encounter   2. Alzheimer's dementia     MDM  79 year old female with history of severe dementia and frequent falls presents after un witnessed fall at nursing home.  Physical exam reveals no point  tenderness, no  hematomas or skin abrasions noted.  Patient has no focal complaints other than uncomfortable feeling of buttocks. -Will obtain Xrays of pelvis to evaluate for fracture. -Xrays negative for pelvic or femur fx. -Patient continues to deny pain. Patient will be discharged back to nursing home with follow up by Dr. Redmond School.  Carlynn Purl, DO 11/27/12 [phone]

## 2012-11-28 NOTE — ED Provider Notes (Signed)
Medical screening examination/treatment/procedure(s) were conducted as a shared visit with non-physician practitioner(s) or resident and myself. I personally evaluated the patient during the encounter and agree with the findings and plan unless otherwise indicated.  Fall from seated, forward. No signs of head trauma, no blood thinners. Dementia hx. Pt at baseline. Daughter in the room. Possible complaint of hip pain. Full rom of hips without acute discomfort. No signs of trauma, Full rom of neck, no midline vertebral tenderness. Xray pelvis reviewed- no acute findings and close fup outpt. Daughter comfortable with plan.    Enid Skeens, MD 11/28/12 2151

## 2013-02-04 ENCOUNTER — Encounter (HOSPITAL_COMMUNITY): Payer: Self-pay | Admitting: Emergency Medicine

## 2013-02-04 ENCOUNTER — Emergency Department (HOSPITAL_COMMUNITY): Payer: Medicare Other

## 2013-02-04 ENCOUNTER — Emergency Department (HOSPITAL_COMMUNITY)
Admission: EM | Admit: 2013-02-04 | Discharge: 2013-02-04 | Disposition: A | Discharge status: Home / Self Care | Payer: Medicare Other | Attending: Emergency Medicine | Admitting: Emergency Medicine

## 2013-02-04 DIAGNOSIS — S0003XA Contusion of scalp, initial encounter: Secondary | ICD-10-CM | POA: Insufficient documentation

## 2013-02-04 DIAGNOSIS — IMO0002 Reserved for concepts with insufficient information to code with codable children: Secondary | ICD-10-CM | POA: Insufficient documentation

## 2013-02-04 DIAGNOSIS — I509 Heart failure, unspecified: Secondary | ICD-10-CM | POA: Insufficient documentation

## 2013-02-04 DIAGNOSIS — Z79899 Other long term (current) drug therapy: Secondary | ICD-10-CM | POA: Insufficient documentation

## 2013-02-04 DIAGNOSIS — S0993XA Unspecified injury of face, initial encounter: Secondary | ICD-10-CM | POA: Insufficient documentation

## 2013-02-04 DIAGNOSIS — Z85820 Personal history of malignant melanoma of skin: Secondary | ICD-10-CM | POA: Insufficient documentation

## 2013-02-04 DIAGNOSIS — G309 Alzheimer's disease, unspecified: Secondary | ICD-10-CM | POA: Insufficient documentation

## 2013-02-04 DIAGNOSIS — Y9210 Unspecified residential institution as the place of occurrence of the external cause: Secondary | ICD-10-CM | POA: Insufficient documentation

## 2013-02-04 DIAGNOSIS — R011 Cardiac murmur, unspecified: Secondary | ICD-10-CM | POA: Insufficient documentation

## 2013-02-04 DIAGNOSIS — Z7982 Long term (current) use of aspirin: Secondary | ICD-10-CM | POA: Insufficient documentation

## 2013-02-04 DIAGNOSIS — Z792 Long term (current) use of antibiotics: Secondary | ICD-10-CM | POA: Insufficient documentation

## 2013-02-04 DIAGNOSIS — Y939 Activity, unspecified: Secondary | ICD-10-CM | POA: Insufficient documentation

## 2013-02-04 DIAGNOSIS — Z8673 Personal history of transient ischemic attack (TIA), and cerebral infarction without residual deficits: Secondary | ICD-10-CM | POA: Insufficient documentation

## 2013-02-04 DIAGNOSIS — Z9181 History of falling: Secondary | ICD-10-CM | POA: Insufficient documentation

## 2013-02-04 DIAGNOSIS — W06XXXA Fall from bed, initial encounter: Secondary | ICD-10-CM | POA: Insufficient documentation

## 2013-02-04 DIAGNOSIS — E785 Hyperlipidemia, unspecified: Secondary | ICD-10-CM | POA: Insufficient documentation

## 2013-02-04 DIAGNOSIS — I10 Essential (primary) hypertension: Secondary | ICD-10-CM | POA: Insufficient documentation

## 2013-02-04 DIAGNOSIS — F0280 Dementia in other diseases classified elsewhere without behavioral disturbance: Secondary | ICD-10-CM | POA: Insufficient documentation

## 2013-02-04 DIAGNOSIS — W19XXXA Unspecified fall, initial encounter: Secondary | ICD-10-CM

## 2013-02-04 NOTE — ED Notes (Signed)
MD at Bedside.

## 2013-02-04 NOTE — ED Notes (Signed)
Report called to [facility] and given to [facility].  [facility] here to transport pt back.

## 2013-02-04 NOTE — ED Notes (Signed)
Pt brought to ED via [facility] after reported falling out of bed at nursing home.  Pt has hematoma with abrasion to left forehead, abrasion to bridge of nose.  Skin tear to left forearm and left knee.

## 2013-02-04 NOTE — ED Provider Notes (Signed)
CSN: [id number]     Arrival date & time 02/04/13  1742 History   First MD Initiated Contact with Patient 02/04/13 1743     Chief Complaint  Patient presents with  . Fall   (Consider location/radiation/quality/duration/timing/severity/associated sxs/prior Treatment) HPI Patient presents from nursing facility after falling.  Per report the patient fell from her head. Patient has dementia, level V caveat. Per report there was no loss of consciousness, and the patient is hemodynamic stable in route via EMS.  Past Medical History  Diagnosis Date  . CHF (congestive heart failure)     EF 55-60% on 06/18/09  . HTN (hypertension)   . Alzheimer's dementia     significant dementia  . Stroke     History of CVA, with resulting R eye blindness  . Blindness of right eye     s/p stroke  . Aortic stenosis   . Hyperlipidemia   . Urinary incontinence     wears diapers at baseline  . Melanoma   . Frequent falls     several falls, likely mechanical   Past Surgical History  Procedure Laterality Date  . Cholecystectomy    . Partial colectomy     No family history on file. History  Substance Use Topics  . Smoking status: Never Smoker   . Smokeless tobacco: Not on file  . Alcohol Use: No   OB History   Grav Para Term Preterm Abortions TAB SAB Ect Mult Living                 Review of Systems  Unable to perform ROS: Dementia    Allergies  Percocet  Home Medications   Current Outpatient Rx  Name  Route  Sig  Dispense  Refill  . acetaminophen (TYLENOL) 500 MG tablet   Oral   Take 500 mg by mouth 2 (two) times daily.         Marland Kitchen aspirin 81 MG chewable tablet   Oral   Chew 81 mg by mouth every morning.          Marland Kitchen atorvastatin (LIPITOR) 20 MG tablet   Oral   Take 20 mg by mouth every evening.         . bacitracin 500 UNIT/GM ointment   Topical   Apply 1 application topically 3 (three) times daily.         . Calcium Carbonate-Vitamin D (CALCIUM-D) 600-400 MG-UNIT  TABS   Oral   Take 1 tablet by mouth daily.         . cholecalciferol (VITAMIN D) 1000 UNITS tablet   Oral   Take 1,000 Units by mouth daily.         Marland Kitchen Co-Enzyme Q-10 100 MG CAPS   Oral   Take 1 capsule by mouth every morning. Hold while in hospital         . Cranberry 475 MG CAPS   Oral   Take 475 mg by mouth 2 (two) times daily.         Marland Kitchen donepezil (ARICEPT) 5 MG tablet   Oral   Take 5 mg by mouth at bedtime.         . miconazole (BAZA ANTIFUNGAL) 2 % cream   Topical   Apply 1 application topically 2 (two) times daily. Apply topically to sacral area         . Multiple Vitamins-Minerals (CERTAVITE/ANTIOXIDANTS) TABS   Oral   Take 1 tablet by mouth daily.         Marland Kitchen  polyvinyl alcohol (LIQUIFILM TEARS) 1.4 % ophthalmic solution   Both Eyes   Place 1 drop into both eyes as needed. Dry eyes         . ramipril (ALTACE) 2.5 MG capsule   Oral   Take 2.5 mg by mouth daily.         . sertraline (ZOLOFT) 50 MG tablet   Oral   Take 50 mg by mouth at bedtime.           . Skin Protectants, Misc. (EUCERIN) cream   Topical   Apply 1 application topically 2 (two) times daily. *to arms and legs          BP 144/64  Pulse 66  Temp(Src) 98.9 F (37.2 C) (Oral)  Resp 20  SpO2 97% Physical Exam  Nursing note and vitals reviewed. Constitutional:  Elderly female, very uncomfortable appearing, moving all extremity spontaneously, yelling  HENT:  Head:    Nose: No septal deviation or nasal septal hematoma. No epistaxis.  Eyes: Conjunctivae are normal. Right eye exhibits no discharge. Left eye exhibits no discharge.  Neck: No tracheal deviation present. No thyromegaly present.  Patient has tenderness to palpation throughout the posterior neck, is hesitant to move the neck  Cardiovascular: Normal rate, regular rhythm and intact distal pulses.   Murmur heard. Pulmonary/Chest: Effort normal. No respiratory distress.  Abdominal: She exhibits no distension.  There is no tenderness.  Musculoskeletal:  Pelvis is stable.  The patient moves all 4 extremities spontaneously.  There multiple gross skin deformity, but no gross bone deformities.  Patient does not cooperate with exam  Skin:     Psychiatric: Her affect is labile. Her speech is delayed and tangential. She is agitated, slowed and withdrawn. Cognition and memory are impaired. She expresses inappropriate judgment.    ED Course  Procedures (including critical care time) Labs Review Labs Reviewed - No data to display Imaging Review Ct Head Wo Contrast  02/04/2013   CLINICAL DATA:  Fall  EXAM: CT HEAD WITHOUT CONTRAST  CT MAXILLOFACIAL WITHOUT CONTRAST  CT CERVICAL SPINE WITHOUT CONTRAST  TECHNIQUE: Multidetector CT imaging of the head, cervical spine, and maxillofacial structures were performed using the standard protocol without intravenous contrast. Multiplanar CT image reconstructions of the cervical spine and maxillofacial structures were also generated.  COMPARISON:  09/08/2012  FINDINGS: CT HEAD FINDINGS  Image quality degraded by motion.  Small left frontal scalp hematoma. Negative for skull fracture.  Negative for intracranial hemorrhage. There is advanced atrophy. Moderate chronic microvascular ischemic changes in the white matter. No acute infarct or mass.  CT MAXILLOFACIAL FINDINGS  Small scalp hematoma left frontal region. Negative for facial fracture. Negative for nasal bone fracture. Negative for orbit or mandible fracture. Paranasal sinuses are clear.  CT CERVICAL SPINE FINDINGS  Negative for fracture in the cervical spine. Normal alignment. Disc degeneration and spondylosis throughout the cervical spine. Marked facet degeneration on the left at C3-4 and C4-5 causing left foraminal encroachment at C3-4. Left carotid artery calcification.  IMPRESSION: No acute intracranial abnormality.  Negative for facial fracture  Negative for cervical spine fracture.   Electronically Signed   By: Marlan Palau M.D.   On: 02/04/2013 18:55   Ct Cervical Spine Wo Contrast  02/04/2013   CLINICAL DATA:  Fall  EXAM: CT HEAD WITHOUT CONTRAST  CT MAXILLOFACIAL WITHOUT CONTRAST  CT CERVICAL SPINE WITHOUT CONTRAST  TECHNIQUE: Multidetector CT imaging of the head, cervical spine, and maxillofacial structures were performed using the standard protocol  without intravenous contrast. Multiplanar CT image reconstructions of the cervical spine and maxillofacial structures were also generated.  COMPARISON:  09/08/2012  FINDINGS: CT HEAD FINDINGS  Image quality degraded by motion.  Small left frontal scalp hematoma. Negative for skull fracture.  Negative for intracranial hemorrhage. There is advanced atrophy. Moderate chronic microvascular ischemic changes in the white matter. No acute infarct or mass.  CT MAXILLOFACIAL FINDINGS  Small scalp hematoma left frontal region. Negative for facial fracture. Negative for nasal bone fracture. Negative for orbit or mandible fracture. Paranasal sinuses are clear.  CT CERVICAL SPINE FINDINGS  Negative for fracture in the cervical spine. Normal alignment. Disc degeneration and spondylosis throughout the cervical spine. Marked facet degeneration on the left at C3-4 and C4-5 causing left foraminal encroachment at C3-4. Left carotid artery calcification.  IMPRESSION: No acute intracranial abnormality.  Negative for facial fracture  Negative for cervical spine fracture.   Electronically Signed   By: Marlan Palau M.D.   On: 02/04/2013 18:55   Dg Pelvis Portable  02/04/2013   CLINICAL DATA:  Fall and right hip pain.  EXAM: PORTABLE PELVIS  COMPARISON:  11/27/2012  FINDINGS: Pelvic bony ring is intact. Multiple phleboliths in the pelvis. Postsurgical changes in the right lower quadrant. Hips are grossly intact.  IMPRESSION: No acute bone abnormality to the pelvis.   Electronically Signed   By: Richarda Overlie M.D.   On: 02/04/2013 18:30   Dg Chest Port 1 View  02/04/2013   CLINICAL DATA:  Fall  today.  EXAM: PORTABLE CHEST - 1 VIEW  COMPARISON:  12/27/2011  FINDINGS: Single view of the chest was obtained. There is no evidence for a pneumothorax. The bony thorax is grossly intact. Heart and mediastinum are within normal limits. Increased markings at the right lung base. The trachea is midline.  IMPRESSION: Increased lung markings at the right lung base. Findings probably represent atelectasis. Cannot exclude a subtle area of infection or aspiration. This could be further evaluated with a two view chest examination.   Electronically Signed   By: Richarda Overlie M.D.   On: 02/04/2013 18:27   Ct Maxillofacial Wo Cm  02/04/2013   CLINICAL DATA:  Fall  EXAM: CT HEAD WITHOUT CONTRAST  CT MAXILLOFACIAL WITHOUT CONTRAST  CT CERVICAL SPINE WITHOUT CONTRAST  TECHNIQUE: Multidetector CT imaging of the head, cervical spine, and maxillofacial structures were performed using the standard protocol without intravenous contrast. Multiplanar CT image reconstructions of the cervical spine and maxillofacial structures were also generated.  COMPARISON:  09/08/2012  FINDINGS: CT HEAD FINDINGS  Image quality degraded by motion.  Small left frontal scalp hematoma. Negative for skull fracture.  Negative for intracranial hemorrhage. There is advanced atrophy. Moderate chronic microvascular ischemic changes in the white matter. No acute infarct or mass.  CT MAXILLOFACIAL FINDINGS  Small scalp hematoma left frontal region. Negative for facial fracture. Negative for nasal bone fracture. Negative for orbit or mandible fracture. Paranasal sinuses are clear.  CT CERVICAL SPINE FINDINGS  Negative for fracture in the cervical spine. Normal alignment. Disc degeneration and spondylosis throughout the cervical spine. Marked facet degeneration on the left at C3-4 and C4-5 causing left foraminal encroachment at C3-4. Left carotid artery calcification.  IMPRESSION: No acute intracranial abnormality.  Negative for facial fracture  Negative for  cervical spine fracture.   Electronically Signed   By: Marlan Palau M.D.   On: 02/04/2013 18:55    EKG Interpretation   None      Pulse oximetry 100%  room air normal MDM  No diagnosis found. This elderly female presents after a fall at nursing home.  The patient's dementia limits the history of present illness.  However, the patient's gross facial trauma, and given her age, the evidence of trauma, radiographic studies were performed.  These were largely reassuring. The patient remained hemodynamic stable throughout her emergency department course was appropriate for discharge with fall precautions, primary care followup, return instructions.    Gerhard Munch, MD 02/04/13 1911

## 2013-02-04 NOTE — ED Notes (Addendum)
Placed bacitracin on patient's wounds on her forehead and bridge of nose. Covered with gauze and a band-aid respectively.

## 2013-11-18 ENCOUNTER — Emergency Department (HOSPITAL_COMMUNITY)
Admission: EM | Admit: 2013-11-18 | Discharge: 2013-11-18 | Disposition: A | Discharge status: Home / Self Care | Payer: Medicare Other | Attending: Emergency Medicine | Admitting: Emergency Medicine

## 2013-11-18 ENCOUNTER — Emergency Department (HOSPITAL_COMMUNITY): Payer: Medicare Other

## 2013-11-18 ENCOUNTER — Encounter (HOSPITAL_COMMUNITY): Payer: Self-pay | Admitting: Emergency Medicine

## 2013-11-18 DIAGNOSIS — F0390 Unspecified dementia without behavioral disturbance: Secondary | ICD-10-CM

## 2013-11-18 DIAGNOSIS — S79919A Unspecified injury of unspecified hip, initial encounter: Secondary | ICD-10-CM | POA: Insufficient documentation

## 2013-11-18 DIAGNOSIS — F0280 Dementia in other diseases classified elsewhere without behavioral disturbance: Secondary | ICD-10-CM | POA: Insufficient documentation

## 2013-11-18 DIAGNOSIS — Z79899 Other long term (current) drug therapy: Secondary | ICD-10-CM | POA: Insufficient documentation

## 2013-11-18 DIAGNOSIS — G309 Alzheimer's disease, unspecified: Secondary | ICD-10-CM | POA: Insufficient documentation

## 2013-11-18 DIAGNOSIS — Y939 Activity, unspecified: Secondary | ICD-10-CM | POA: Insufficient documentation

## 2013-11-18 DIAGNOSIS — S0990XA Unspecified injury of head, initial encounter: Secondary | ICD-10-CM | POA: Insufficient documentation

## 2013-11-18 DIAGNOSIS — I509 Heart failure, unspecified: Secondary | ICD-10-CM | POA: Diagnosis not present

## 2013-11-18 DIAGNOSIS — Z7982 Long term (current) use of aspirin: Secondary | ICD-10-CM | POA: Insufficient documentation

## 2013-11-18 DIAGNOSIS — Z8673 Personal history of transient ischemic attack (TIA), and cerebral infarction without residual deficits: Secondary | ICD-10-CM | POA: Insufficient documentation

## 2013-11-18 DIAGNOSIS — I10 Essential (primary) hypertension: Secondary | ICD-10-CM | POA: Insufficient documentation

## 2013-11-18 DIAGNOSIS — E785 Hyperlipidemia, unspecified: Secondary | ICD-10-CM | POA: Diagnosis not present

## 2013-11-18 DIAGNOSIS — Z9181 History of falling: Secondary | ICD-10-CM | POA: Insufficient documentation

## 2013-11-18 DIAGNOSIS — W010XXA Fall on same level from slipping, tripping and stumbling without subsequent striking against object, initial encounter: Secondary | ICD-10-CM | POA: Diagnosis not present

## 2013-11-18 DIAGNOSIS — W19XXXA Unspecified fall, initial encounter: Secondary | ICD-10-CM

## 2013-11-18 DIAGNOSIS — Z85820 Personal history of malignant melanoma of skin: Secondary | ICD-10-CM | POA: Insufficient documentation

## 2013-11-18 DIAGNOSIS — IMO0002 Reserved for concepts with insufficient information to code with codable children: Secondary | ICD-10-CM | POA: Insufficient documentation

## 2013-11-18 DIAGNOSIS — I359 Nonrheumatic aortic valve disorder, unspecified: Secondary | ICD-10-CM | POA: Diagnosis not present

## 2013-11-18 DIAGNOSIS — H5440 Blindness, one eye, unspecified eye: Secondary | ICD-10-CM | POA: Insufficient documentation

## 2013-11-18 DIAGNOSIS — Y929 Unspecified place or not applicable: Secondary | ICD-10-CM | POA: Insufficient documentation

## 2013-11-18 DIAGNOSIS — S79929A Unspecified injury of unspecified thigh, initial encounter: Secondary | ICD-10-CM

## 2013-11-18 MED ORDER — ONDANSETRON 4 MG PO TBDP
4.0000 mg | ORAL_TABLET | Freq: Once | ORAL | Status: DC
Start: 2013-11-18 — End: 2013-11-18
  Filled 2013-11-18: qty 1

## 2013-11-18 MED ORDER — HYDROCODONE-ACETAMINOPHEN 5-325 MG PO TABS
1.0000 | ORAL_TABLET | Freq: Once | ORAL | Status: DC
  Filled 2013-11-18: qty 1

## 2013-11-18 NOTE — ED Provider Notes (Signed)
TIME SEEN: 5:28 PM  CHIEF COMPLAINT: Fall  HPI: Patient is a 80 year old female with history of hypertension, CHF, Alzheimer's dementia, prior stroke, hyperlipidemia who presents to the emergency department with a fall. Fall was witnessed and she apparently tripped and fell hitting the right side of her head. She states she recalls the fall and is complaining of right-sided head pain, right hip pain. Denies any chest pressure as a breath. No abdominal pain. No numbness, tingling or focal weakness. Patient has a history of frequent falls.  ROS: See HPI Constitutional: no fever  Eyes: no drainage  ENT: no runny nose   Cardiovascular:  no chest pain  Resp: no SOB  GI: no vomiting GU: no dysuria Integumentary: no rash  Allergy: no hives  Musculoskeletal: no leg swelling  Neurological: no slurred speech ROS otherwise negative  PAST MEDICAL HISTORY/PAST SURGICAL HISTORY:  Past Medical History  Diagnosis Date  . CHF (congestive heart failure)     EF 55-60% on 06/18/09  . HTN (hypertension)   . Alzheimer's dementia     significant dementia  . Stroke     History of CVA, with resulting R eye blindness  . Blindness of right eye     s/p stroke  . Aortic stenosis   . Hyperlipidemia   . Urinary incontinence     wears diapers at baseline  . Melanoma   . Frequent falls     several falls, likely mechanical    MEDICATIONS:  Prior to Admission medications   Medication Sig Start Date End Date Taking? Authorizing Provider  acetaminophen (TYLENOL) 500 MG tablet Take 500 mg by mouth 2 (two) times daily.    Historical Provider, MD  aspirin 81 MG chewable tablet Chew 81 mg by mouth every morning.     Historical Provider, MD  bacitracin 500 UNIT/GM ointment Apply 1 application topically 3 (three) times daily.    Historical Provider, MD  Calcium Carbonate-Vitamin D (CALCIUM-D) 600-400 MG-UNIT TABS Take 1 tablet by mouth daily.    Historical Provider, MD  cholecalciferol (VITAMIN D) 1000 UNITS  tablet Take 1,000 Units by mouth daily.    Historical Provider, MD  Co-Enzyme Q-10 100 MG CAPS Take 1 capsule by mouth every morning. Hold while in hospital    Historical Provider, MD  Cranberry 475 MG CAPS Take 475 mg by mouth 2 (two) times daily.    Historical Provider, MD  donepezil (ARICEPT) 5 MG tablet Take 5 mg by mouth at bedtime.    Historical Provider, MD  ipratropium (ATROVENT) 0.03 % nasal spray Place 2 sprays into the nose 2 (two) times daily as needed for rhinitis.    Historical Provider, MD  miconazole (BAZA ANTIFUNGAL) 2 % cream Apply 1 application topically 2 (two) times daily. Apply topically to sacral area    Historical Provider, MD  Multiple Vitamins-Minerals (CERTAVITE/ANTIOXIDANTS) TABS Take 1 tablet by mouth daily.    Historical Provider, MD  polyvinyl alcohol (LIQUIFILM TEARS) 1.4 % ophthalmic solution Place 1 drop into both eyes as needed. Dry eyes    Historical Provider, MD  ramipril (ALTACE) 2.5 MG capsule Take 2.5 mg by mouth daily.    Historical Provider, MD  Skin Protectants, Misc. (EUCERIN) cream Apply 1 application topically 2 (two) times daily. *to arms and legs    Historical Provider, MD    ALLERGIES:  Allergies  Allergen Reactions  . Percocet [Oxycodone-Acetaminophen] Nausea And Vomiting    SOCIAL HISTORY:  History  Substance Use Topics  . Smoking status: Never  Smoker   . Smokeless tobacco: Not on file  . Alcohol Use: No    FAMILY HISTORY: History reviewed. No pertinent family history.  EXAM: BP 165/63  Pulse 71  Temp(Src) 98.2 F (36.8 C) (Oral)  Resp 16  SpO2 96% CONSTITUTIONAL: Alert and oriented to person only and responds appropriately to questions. Well-appearing; well-nourished; GCS 14, patient is extremely hard of hearing HEAD: Normocephalic; atraumatic EYES: Conjunctivae clear, PERRL, EOMI ENT: normal nose; no rhinorrhea; moist mucous membranes; pharynx without lesions noted; no dental injury; no septal hematoma NECK: Supple, no  meningismus, no LAD; no midline spinal tenderness, step-off or deformity CARD: RRR; S1 and S2 appreciated; no murmurs, no clicks, no rubs, no gallops RESP: Normal chest excursion without splinting or tachypnea; breath sounds clear and equal bilaterally; no wheezes, no rhonchi, no rales; chest wall stable, nontender to palpation ABD/GI: Normal bowel sounds; non-distended; soft, non-tender, no rebound, no guarding PELVIS:  stable, tender to palpation over the right hip, no likely discrepancy BACK:  The back appears normal, there is no CVA tenderness; no diffusely tender to palpation throughout the thoracic and lumbar spine without step-off or deformity EXT: Normal ROM in all joints; non-tender to palpation; no edema; normal capillary refill; no cyanosis    SKIN: Normal color for age and race; warm NEURO: Moves all extremities equally, sensation to light touch intact diffusely, cranial nerves 2 through contact PSYCH: The patient's mood and manner are appropriate. Grooming and personal hygiene are appropriate.  MEDICAL DECISION MAKING: Patient had a mechanical fall. She is having diffuse back pain, right-sided headache and right hip pain. We'll obtain imaging. We'll give pain medication. She is otherwise hemodynamically stable, neurologically intact other than her dementia which is chronic.  ED PROGRESS: Patient's imaging is unremarkable. We'll ambulate.    Patient refuses to walk after multiple attempts. She states "my doctor told me not to walk". She states she does use a walker at her nursing facility. She becomes combative when we try to get her out of bed. Does not have any pain on palpation of her lower extremities currently. Suspect this is secondary to her dementia but do not feel she needs admission because of this. We'll discharge back to her nursing facility.     [place], DO 11/18/13 1920

## 2013-11-18 NOTE — ED Notes (Signed)
Bed: WA24 Expected date:  Expected time:  Means of arrival:  Comments: EMS 

## 2013-11-18 NOTE — Discharge Instructions (Signed)

## 2013-11-18 NOTE — ED Notes (Signed)
PT[facility] was notified of pt's transport back to [facility] at [facility] facility.

## 2013-11-18 NOTE — ED Notes (Signed)
Pt very resistant to walk, states that he doctor told her not to walk. MD and RN aware

## 2013-11-18 NOTE — ED Notes (Signed)
Per EMS pt from [facility] at [facility] care unit tripped and fell today and hit her head. Per EMS patient is ambulatory at home, dementia, at mental baseline.

## 2014-05-06 ENCOUNTER — Encounter (HOSPITAL_COMMUNITY): Payer: Self-pay

## 2014-05-06 ENCOUNTER — Emergency Department (HOSPITAL_COMMUNITY)
Admission: EM | Admit: 2014-05-06 | Discharge: 2014-05-06 | Disposition: A | Discharge status: Home / Self Care | Payer: Medicare Other | Attending: Emergency Medicine | Admitting: Emergency Medicine

## 2014-05-06 ENCOUNTER — Emergency Department (HOSPITAL_COMMUNITY): Payer: Medicare Other

## 2014-05-06 DIAGNOSIS — Y998 Other external cause status: Secondary | ICD-10-CM | POA: Insufficient documentation

## 2014-05-06 DIAGNOSIS — W1839XA Other fall on same level, initial encounter: Secondary | ICD-10-CM | POA: Insufficient documentation

## 2014-05-06 DIAGNOSIS — H5441 Blindness, right eye, normal vision left eye: Secondary | ICD-10-CM | POA: Diagnosis not present

## 2014-05-06 DIAGNOSIS — S0990XA Unspecified injury of head, initial encounter: Secondary | ICD-10-CM | POA: Diagnosis present

## 2014-05-06 DIAGNOSIS — G309 Alzheimer's disease, unspecified: Secondary | ICD-10-CM | POA: Diagnosis not present

## 2014-05-06 DIAGNOSIS — Z9181 History of falling: Secondary | ICD-10-CM | POA: Insufficient documentation

## 2014-05-06 DIAGNOSIS — S0003XA Contusion of scalp, initial encounter: Secondary | ICD-10-CM | POA: Diagnosis not present

## 2014-05-06 DIAGNOSIS — Y9289 Other specified places as the place of occurrence of the external cause: Secondary | ICD-10-CM | POA: Diagnosis not present

## 2014-05-06 DIAGNOSIS — E785 Hyperlipidemia, unspecified: Secondary | ICD-10-CM | POA: Diagnosis not present

## 2014-05-06 DIAGNOSIS — Z7982 Long term (current) use of aspirin: Secondary | ICD-10-CM | POA: Insufficient documentation

## 2014-05-06 DIAGNOSIS — Y9389 Activity, other specified: Secondary | ICD-10-CM | POA: Diagnosis not present

## 2014-05-06 DIAGNOSIS — F0281 Dementia in other diseases classified elsewhere with behavioral disturbance: Secondary | ICD-10-CM | POA: Insufficient documentation

## 2014-05-06 DIAGNOSIS — Z8673 Personal history of transient ischemic attack (TIA), and cerebral infarction without residual deficits: Secondary | ICD-10-CM | POA: Insufficient documentation

## 2014-05-06 DIAGNOSIS — Z85820 Personal history of malignant melanoma of skin: Secondary | ICD-10-CM | POA: Insufficient documentation

## 2014-05-06 DIAGNOSIS — Z79899 Other long term (current) drug therapy: Secondary | ICD-10-CM | POA: Insufficient documentation

## 2014-05-06 DIAGNOSIS — I509 Heart failure, unspecified: Secondary | ICD-10-CM | POA: Insufficient documentation

## 2014-05-06 DIAGNOSIS — W19XXXA Unspecified fall, initial encounter: Secondary | ICD-10-CM

## 2014-05-06 NOTE — ED Notes (Signed)
Bed: WA15 Expected date:  Expected time:  Means of arrival:  Comments: EMS-fall 

## 2014-05-06 NOTE — ED Notes (Signed)
Report called to [facility]

## 2014-05-06 NOTE — ED Notes (Signed)
PTAR called  

## 2014-05-06 NOTE — ED Notes (Signed)
With help from our tech. Edison Nasuti, we stand pt., who shuffles a couple of steps.  She seems to bear her weight without pain and her only c/o is of being cold.  We ger her a warm blanket.

## 2014-05-06 NOTE — ED Provider Notes (Signed)
CSN: [id number]     Arrival date & time 05/06/14  1041 History   First MD Initiated Contact with Patient 05/06/14 1047     Chief Complaint  Patient presents with  . [name]     The history is provided by the nursing home.   Ruth [name].  Level V caveat due to dementia.  Pt denies complaints.  Per NH report she slid out of her wheelchair.  She denies any neck of back pain.  Sxs are moderate, improving. She has not had any recent illnesses.  Past Medical History  Diagnosis Date  . CHF (congestive heart failure)     EF 55-60% on 06/18/09  . HTN (hypertension)   . Alzheimer's dementia     significant dementia  . Stroke     History of CVA, with resulting R eye blindness  . Blindness of right eye     s/p stroke  . Aortic stenosis   . Hyperlipidemia   . Urinary incontinence     wears diapers at baseline  . Melanoma   . Frequent falls     several falls, likely mechanical   Past Surgical History  Procedure Laterality Date  . Cholecystectomy    . Partial colectomy     No family history on file. History  Substance Use Topics  . Smoking status: Never Smoker   . Smokeless tobacco: Not on file  . Alcohol Use: No   OB History    No data available     Review of Systems  Unable to perform ROS     Allergies  Percocet  Home Medications   Prior to Admission medications   Medication Sig Start Date End Date Taking? Authorizing Provider  acetaminophen (TYLENOL) 500 MG tablet Take 500 mg by mouth 2 (two) times daily.   Yes Historical Provider, MD  Acetic Acid (DOUCHE VINEGAR/WATER) SOLN Place 1 application vaginally once a week. 15 ml with 1 pint warm water.  Vaginal wash.   Yes Historical Provider, MD  aspirin 81 MG chewable tablet Chew 81 mg by mouth every morning.    Yes Historical Provider, MD  atorvastatin (LIPITOR) 10 MG tablet Take 10 mg by mouth at bedtime.   Yes Historical Provider, MD  Cranberry 475 MG CAPS Take 475  mg by mouth 2 (two) times daily.   Yes Historical Provider, MD  divalproex (DEPAKOTE) 125 MG DR tablet Take 125 mg by mouth at bedtime.   Yes Historical Provider, MD  docusate sodium (COLACE) 100 MG capsule Take 100 mg by mouth at bedtime.   Yes Historical Provider, MD  GATORADE, BH, Take 240 mLs by mouth daily. Serve room temperature.   Yes Historical Provider, MD  miconazole (BAZA ANTIFUNGAL) 2 % cream Apply 1 application topically 2 (two) times daily. Apply topically to sacral area   Yes Historical Provider, MD  Multiple Vitamin (MULTIVITAMIN WITH MINERALS) TABS tablet Take 1 tablet by mouth daily.   Yes Historical Provider, MD  psyllium (FIBER LAXATIVE) 0.52 G capsule Take 0.52 g by mouth at bedtime.   Yes Historical Provider, MD  QUEtiapine (SEROQUEL) 25 MG tablet Take 12.5 mg by mouth 3 (three) times daily.   Yes Historical Provider, MD  ramipril (ALTACE) 2.5 MG capsule Take 2.5 mg by mouth daily.   Yes Historical Provider, MD  sertraline (ZOLOFT) 25 MG tablet Take 37.5 mg by mouth daily. 1.5 tablet (37.5 mg) daily.   Yes Historical Provider, MD  Skin  Protectants, Misc. (EUCERIN) cream Apply 1 application topically 2 (two) times daily. *to arms and legs. Check for bruising and skin tears.   Yes Historical Provider, MD  ipratropium (ATROVENT) 0.03 % nasal spray Place 2 sprays into the nose 2 (two) times daily as needed for rhinitis.    Historical Provider, MD  Multiple Vitamins-Minerals (CERTAVITE/ANTIOXIDANTS) TABS Take 1 tablet by mouth daily.    Historical Provider, MD  Neomycin-Bacitracin-Polymyxin (HCA TRIPLE ANTIBIOTIC OINTMENT EX) Apply 1 application topically See admin instructions. Clean skin tear on left leg daily with soap and water. Then apply triple antibiotic cream and cover with nonstick dressing once daily.    Historical Provider, MD  polyvinyl alcohol (LIQUIFILM TEARS) 1.4 % ophthalmic solution Place 1 drop into both eyes as needed. Dry eyes    Historical Provider, MD   BP  158/56 mmHg  Pulse 69  Temp(Src) 97.6 F (36.4 C) (Oral)  Resp 16  SpO2 98% Physical Exam  Constitutional: She appears well-developed and well-nourished.  HENT:  Head: Normocephalic and atraumatic.  Ecchymosis over central posterior scalp  Cardiovascular: Normal rate and regular rhythm.   No murmur heard. Pulmonary/Chest: Effort normal and breath sounds normal. No respiratory distress.  Abdominal: Soft. There is no tenderness. There is no rebound and no guarding.  Musculoskeletal: She exhibits no edema or tenderness.  No C/T/L spine tenderness, no hip tenderness  Neurological: She is alert.  Extremely hard of hearing, MAE  Skin: Skin is warm and dry.  Psychiatric: She has a normal mood and affect. Her behavior is normal.  Nursing note and vitals reviewed.   ED Course  Procedures (including critical care time) Labs Review Labs Reviewed - No data to display  Imaging Review Ct Head Wo Contrast  05/06/2014   CLINICAL DATA:  [name] from wheelchair  EXAM: CT HEAD WITHOUT CONTRAST  CT CERVICAL SPINE WITHOUT CONTRAST  TECHNIQUE: Multidetector CT imaging of the head and cervical spine was performed following the standard protocol without intravenous contrast. Multiplanar CT image reconstructions of the cervical spine were also generated.  COMPARISON:  11/18/2013  FINDINGS: CT HEAD FINDINGS  No skull fracture is noted. Paranasal sinuses and mastoid air cells are unremarkable. No intracranial hemorrhage, mass effect or midline shift. Cerebral atrophy again noted. Stable extensive periventricular and patchy subcortical chronic white matter disease. No acute cortical infarction. No mass lesion is noted on this unenhanced scan. Ventricular size is stable from prior exam.  CT CERVICAL SPINE FINDINGS  Axial images of the cervical spine shows no acute fracture or subluxation. Computer processed images shows no acute fracture or subluxation. Again noted degenerative changes C1-C2 articulation. There is  disc space flattening with mild to moderate anterior spurring at C2-C3 level. Mild posterior disc bulge at C3-C4 level. Mild anterior spurring with minimal disc space flattening at C4-C5 level. There is moderate disc space flattening with moderate anterior spurring and mild posterior spurring at C5-C6 level. Moderate disc space flattening with mild anterior spurring at C6-C7 and C7-T1 level. Mild posterior disc bulge and mild spinal canal stenosis due to posterior spurring at C5-C6 level. No prevertebral soft tissue swelling. Cervical airway is patent.  IMPRESSION: 1. No acute intracranial abnormality. Stable cerebral atrophy. Stable extensive chronic white matter disease. 2. No cervical spine acute fracture or subluxation. Multilevel degenerative changes as described above.   Electronically Signed   By: Lahoma Crocker M.D.   On: 05/06/2014 13:01   Ct Cervical Spine Wo Contrast  05/06/2014   CLINICAL DATA:  [name] from  wheelchair  EXAM: CT HEAD WITHOUT CONTRAST  CT CERVICAL SPINE WITHOUT CONTRAST  TECHNIQUE: Multidetector CT imaging of the head and cervical spine was performed following the standard protocol without intravenous contrast. Multiplanar CT image reconstructions of the cervical spine were also generated.  COMPARISON:  11/18/2013  FINDINGS: CT HEAD FINDINGS  No skull fracture is noted. Paranasal sinuses and mastoid air cells are unremarkable. No intracranial hemorrhage, mass effect or midline shift. Cerebral atrophy again noted. Stable extensive periventricular and patchy subcortical chronic white matter disease. No acute cortical infarction. No mass lesion is noted on this unenhanced scan. Ventricular size is stable from prior exam.  CT CERVICAL SPINE FINDINGS  Axial images of the cervical spine shows no acute fracture or subluxation. Computer processed images shows no acute fracture or subluxation. Again noted degenerative changes C1-C2 articulation. There is disc space flattening with mild to moderate  anterior spurring at C2-C3 level. Mild posterior disc bulge at C3-C4 level. Mild anterior spurring with minimal disc space flattening at C4-C5 level. There is moderate disc space flattening with moderate anterior spurring and mild posterior spurring at C5-C6 level. Moderate disc space flattening with mild anterior spurring at C6-C7 and C7-T1 level. Mild posterior disc bulge and mild spinal canal stenosis due to posterior spurring at C5-C6 level. No prevertebral soft tissue swelling. Cervical airway is patent.  IMPRESSION: 1. No acute intracranial abnormality. Stable cerebral atrophy. Stable extensive chronic white matter disease. 2. No cervical spine acute fracture or subluxation. Multilevel degenerative changes as described above.   Electronically Signed   By: Lahoma Crocker M.D.   On: 05/06/2014 13:01     EKG Interpretation None      MDM   Final diagnoses:  [name], initial encounter  Scalp contusion, initial encounter    Patient here for evaluation of injuries following a [name], patient is without complaints in the emergency department. History is limited given patient is very hard of hearing and she does have some underlying dementia. Patient is able to stand the emergency department without any complaints of pain but she is unsteady on her feet. No recent illnesses or symptoms concerning for acute bacterial infection or major electrolyte abnormality. Plan to send back to nursing facility with PCP follow-up. Discussed with son Findings of Films and Need for Recheck If She Has Any Symptoms.    Quintella Reichert, MD 05/06/14 [phone]

## 2014-05-06 NOTE — ED Notes (Signed)
She was seen by staff at [facility] to "slide out of her wheelchair".  She arrives here with K.E.D. Thoracic spine/c-spine immobilization in no distress.  She is markedly h.o.h.  She tells [place] the immobilization is her only c/o.

## 2014-05-06 NOTE — ED Notes (Signed)
Per EMS, slid out of wheelchair this am-no obvious injury-patient c/o pain over thoracis area-witnessed fall, hit head on arm rest of wheelchair, no LOC

## 2014-05-06 NOTE — Discharge Instructions (Signed)
Ruth [name] had films of her head and neck performed.  There are no new injuries, only chronic changes.  Please get her rechecked if she develops any new or concerning symptoms.      Facial or Scalp Contusion A facial or scalp contusion is a deep bruise on the face or head. Injuries to the face and head generally cause a lot of swelling, especially around the eyes. Contusions are the result of an injury that caused bleeding under the skin. The contusion may turn blue, purple, or yellow. Minor injuries will give you a painless contusion, but more severe contusions may stay painful and swollen for a few weeks.  CAUSES  A facial or scalp contusion is caused by a blunt injury or trauma to the face or head area.  SIGNS AND SYMPTOMS   Swelling of the injured area.   Discoloration of the injured area.   Tenderness, soreness, or pain in the injured area.  DIAGNOSIS  The diagnosis can be made by taking a medical history and doing a physical exam. An X-ray exam, CT scan, or MRI may be needed to determine if there are any associated injuries, such as broken bones (fractures). TREATMENT  Often, the best treatment for a facial or scalp contusion is applying cold compresses to the injured area. Over-the-counter medicines may also be recommended for pain control.  HOME CARE INSTRUCTIONS   Only take over-the-counter or prescription medicines as directed by your health care provider.   Apply ice to the injured area.   Put ice in a plastic bag.   Place a towel between your skin and the bag.   Leave the ice on for 20 minutes, 2-3 times a day.  SEEK MEDICAL CARE IF:  You have bite problems.   You have pain with chewing.   You are concerned about facial defects. SEEK IMMEDIATE MEDICAL CARE IF:  You have severe pain or a headache that is not relieved by medicine.   You have unusual sleepiness, confusion, or personality changes.   You throw up (vomit).   You have a persistent  nosebleed.   You have double vision or blurred vision.   You have fluid drainage from your nose or ear.   You have difficulty walking or using your arms or legs.  MAKE SURE YOU:   Understand these instructions.  Will watch your condition.  Will get help right away if you are not doing well or get worse. Document Released: 05/11/2004 Document Revised: 01/22/2013 Document Reviewed: 11/14/2012 [facility] Patient Information 2015 [place], [place]. This information is not intended to replace advice given to you by your health care provider. Make sure you discuss any questions you have with your health care provider.  Fall Prevention and Home Safety Falls cause injuries and can affect all age groups. It is possible to use preventive measures to significantly decrease the likelihood of falls. There are many simple measures which can make your home safer and prevent falls. OUTDOORS  Repair cracks and edges of walkways and driveways.  Remove high doorway thresholds.  Trim shrubbery on the main path into your home.  Have good outside lighting.  Clear walkways of tools, rocks, debris, and clutter.  Check that handrails are not broken and are securely fastened. Both sides of steps should have handrails.  Have leaves, snow, and ice cleared regularly.  Use sand or salt on walkways during winter months.  In the garage, clean up grease or oil spills. BATHROOM  Install night lights.  Install  grab bars by the toilet and in the tub and shower.  Use non-skid mats or decals in the tub or shower.  Place a plastic non-slip stool in the shower to sit on, if needed.  Keep floors dry and clean up all water on the floor immediately.  Remove soap buildup in the tub or shower on a regular basis.  Secure bath mats with non-slip, double-sided rug tape.  Remove throw rugs and tripping hazards from the floors. BEDROOMS  Install night lights.  Make sure a bedside light is easy to  reach.  Do not use oversized bedding.  Keep a telephone by your bedside.  Have a firm chair with side arms to use for getting dressed.  Remove throw rugs and tripping hazards from the floor. KITCHEN  Keep handles on pots and pans turned toward the center of the stove. Use back burners when possible.  Clean up spills quickly and allow time for drying.  Avoid walking on wet floors.  Avoid hot utensils and knives.  Position shelves so they are not too high or low.  Place commonly used objects within easy reach.  If necessary, use a sturdy step stool with a grab bar when reaching.  Keep electrical cables out of the way.  Do not use floor polish or wax that makes floors slippery. If you must use wax, use non-skid floor wax.  Remove throw rugs and tripping hazards from the floor. STAIRWAYS  Never leave objects on stairs.  Place handrails on both sides of stairways and use them. Fix any loose handrails. Make sure handrails on both sides of the stairways are as long as the stairs.  Check carpeting to make sure it is firmly attached along stairs. Make repairs to worn or loose carpet promptly.  Avoid placing throw rugs at the top or bottom of stairways, or properly secure the rug with carpet tape to prevent slippage. Get rid of throw rugs, if possible.  Have an electrician put in a light switch at the top and bottom of the stairs. OTHER FALL PREVENTION TIPS  Wear low-heel or rubber-soled shoes that are supportive and fit well. Wear closed toe shoes.  When using a stepladder, make sure it is fully opened and both spreaders are firmly locked. Do not climb a closed stepladder.  Add color or contrast paint or tape to grab bars and handrails in your home. Place contrasting color strips on first and last steps.  Learn and use mobility aids as needed. Install an electrical emergency response system.  Turn on lights to avoid dark areas. Replace light bulbs that burn out immediately.  Get light switches that glow.  Arrange furniture to create clear pathways. Keep furniture in the same place.  Firmly attach carpet with non-skid or double-sided tape.  Eliminate uneven floor surfaces.  Select a carpet pattern that does not visually hide the edge of steps.  Be aware of all pets. OTHER HOME SAFETY TIPS  Set the water temperature for 120 F (48.8 C).  Keep emergency numbers on or near the telephone.  Keep smoke detectors on every level of the home and near sleeping areas. Document Released: 03/24/2002 Document Revised: 10/03/2011 Document Reviewed: 06/23/2011 [facility] Patient Information 2015 [place], [place]. This information is not intended to replace advice given to you by your health care provider. Make sure you discuss any questions you have with your health care provider.

## 2014-05-25 ENCOUNTER — Encounter (HOSPITAL_COMMUNITY): Payer: Self-pay | Admitting: Emergency Medicine

## 2014-05-25 ENCOUNTER — Emergency Department (HOSPITAL_COMMUNITY)
Admission: EM | Admit: 2014-05-25 | Discharge: 2014-05-25 | Disposition: A | Discharge status: Home / Self Care | Payer: Medicare Other | Attending: Emergency Medicine | Admitting: Emergency Medicine

## 2014-05-25 DIAGNOSIS — Z8673 Personal history of transient ischemic attack (TIA), and cerebral infarction without residual deficits: Secondary | ICD-10-CM | POA: Insufficient documentation

## 2014-05-25 DIAGNOSIS — I10 Essential (primary) hypertension: Secondary | ICD-10-CM | POA: Insufficient documentation

## 2014-05-25 DIAGNOSIS — S60512A Abrasion of left hand, initial encounter: Secondary | ICD-10-CM | POA: Insufficient documentation

## 2014-05-25 DIAGNOSIS — Z9181 History of falling: Secondary | ICD-10-CM | POA: Diagnosis not present

## 2014-05-25 DIAGNOSIS — I509 Heart failure, unspecified: Secondary | ICD-10-CM | POA: Diagnosis not present

## 2014-05-25 DIAGNOSIS — E785 Hyperlipidemia, unspecified: Secondary | ICD-10-CM | POA: Diagnosis not present

## 2014-05-25 DIAGNOSIS — Y9289 Other specified places as the place of occurrence of the external cause: Secondary | ICD-10-CM | POA: Insufficient documentation

## 2014-05-25 DIAGNOSIS — T148XXA Other injury of unspecified body region, initial encounter: Secondary | ICD-10-CM

## 2014-05-25 DIAGNOSIS — Y9389 Activity, other specified: Secondary | ICD-10-CM | POA: Insufficient documentation

## 2014-05-25 DIAGNOSIS — Z7982 Long term (current) use of aspirin: Secondary | ICD-10-CM | POA: Insufficient documentation

## 2014-05-25 DIAGNOSIS — S80211A Abrasion, right knee, initial encounter: Secondary | ICD-10-CM | POA: Diagnosis not present

## 2014-05-25 DIAGNOSIS — S0081XA Abrasion of other part of head, initial encounter: Secondary | ICD-10-CM | POA: Insufficient documentation

## 2014-05-25 DIAGNOSIS — F0280 Dementia in other diseases classified elsewhere without behavioral disturbance: Secondary | ICD-10-CM | POA: Diagnosis not present

## 2014-05-25 DIAGNOSIS — Z79899 Other long term (current) drug therapy: Secondary | ICD-10-CM | POA: Diagnosis not present

## 2014-05-25 DIAGNOSIS — G309 Alzheimer's disease, unspecified: Secondary | ICD-10-CM | POA: Insufficient documentation

## 2014-05-25 DIAGNOSIS — Z85820 Personal history of malignant melanoma of skin: Secondary | ICD-10-CM | POA: Insufficient documentation

## 2014-05-25 DIAGNOSIS — Y998 Other external cause status: Secondary | ICD-10-CM | POA: Insufficient documentation

## 2014-05-25 DIAGNOSIS — W010XXA Fall on same level from slipping, tripping and stumbling without subsequent striking against object, initial encounter: Secondary | ICD-10-CM | POA: Insufficient documentation

## 2014-05-25 DIAGNOSIS — S0993XA Unspecified injury of face, initial encounter: Secondary | ICD-10-CM | POA: Diagnosis present

## 2014-05-25 DIAGNOSIS — W19XXXA Unspecified fall, initial encounter: Secondary | ICD-10-CM

## 2014-05-25 MED ORDER — HALOPERIDOL LACTATE 5 MG/ML IJ SOLN
2.0000 mg | Freq: Once | INTRAMUSCULAR | Status: AC
  Administered 2014-05-25: 2 mg via INTRAMUSCULAR
  Filled 2014-05-25: qty 1

## 2014-05-25 MED ORDER — DIPHENHYDRAMINE HCL 50 MG/ML IJ SOLN: 12.5000 mg | Freq: Once | INTRAMUSCULAR | Status: DC

## 2014-05-25 MED ORDER — LORAZEPAM 2 MG/ML IJ SOLN
1.0000 mg | Freq: Once | INTRAMUSCULAR | Status: AC
  Administered 2014-05-25: 1 mg via INTRAMUSCULAR
  Filled 2014-05-25: qty 1

## 2014-05-25 NOTE — ED Notes (Addendum)
Pt agitated with any manipulation/intervention.   Sterile saline applied to skin tears on bilateral knees; band-aid applied. Fall risk/yellow socks applied. Pt agitated; will allow medication intervention prior to remainder wound care; skin tear to left elbow and nose.

## 2014-05-25 NOTE — ED Provider Notes (Signed)
CSN: [id number]     Arrival date & time 05/25/14  0710 History   First MD Initiated Contact with Patient 05/25/14 0720     Chief Complaint  Patient presents with  . Fall      HPI Per EMS, pt from, staff states patient tripped from standing position, landed on face, laceration and ecchymosis to nose, bilateral ecchymosis to knees, skin tear to right knee, skin tear to left arm. Staff denies blood thinners for patients, pt will scream if touched. Staff states pt is at mental baseline. Past Medical History  Diagnosis Date  . CHF (congestive heart failure)     EF 55-60% on 06/18/09  . HTN (hypertension)   . Alzheimer's dementia     significant dementia  . Stroke     History of CVA, with resulting R eye blindness  . Blindness of right eye     s/p stroke  . Aortic stenosis   . Hyperlipidemia   . Urinary incontinence     wears diapers at baseline  . Melanoma   . Frequent falls     several falls, likely mechanical   Past Surgical History  Procedure Laterality Date  . Cholecystectomy    . Partial colectomy     History reviewed. No pertinent family history. History  Substance Use Topics  . Smoking status: Never Smoker   . Smokeless tobacco: Not on file  . Alcohol Use: No   OB History    No data available     Review of Systems  Unable to perform ROS: Dementia      Allergies  Percocet  Home Medications   Prior to Admission medications   Medication Sig Start Date End Date Taking? Authorizing Provider  acetaminophen (TYLENOL) 500 MG tablet Take 500 mg by mouth 2 (two) times daily.    Historical Provider, MD  Acetic Acid (DOUCHE VINEGAR/WATER) SOLN Place 1 application vaginally once a week. 15 ml with 1 pint warm water.  Vaginal wash.    Historical Provider, MD  aspirin 81 MG chewable tablet Chew 81 mg by mouth every morning.     Historical Provider, MD  atorvastatin (LIPITOR) 10 MG tablet Take 10 mg by mouth at bedtime.    Historical Provider, MD  Cranberry 475 MG CAPS  Take 475 mg by mouth 2 (two) times daily.    Historical Provider, MD  divalproex (DEPAKOTE) 125 MG DR tablet Take 125 mg by mouth at bedtime.    Historical Provider, MD  docusate sodium (COLACE) 100 MG capsule Take 100 mg by mouth at bedtime.    Historical Provider, MD  GATORADE, BH, Take 240 mLs by mouth daily. Serve room temperature.    Historical Provider, MD  ipratropium (ATROVENT) 0.03 % nasal spray Place 2 sprays into the nose 2 (two) times daily as needed for rhinitis.    Historical Provider, MD  miconazole (BAZA ANTIFUNGAL) 2 % cream Apply 1 application topically 2 (two) times daily. Apply topically to sacral area    Historical Provider, MD  Multiple Vitamin (MULTIVITAMIN WITH MINERALS) TABS tablet Take 1 tablet by mouth daily.    Historical Provider, MD  Multiple Vitamins-Minerals (CERTAVITE/ANTIOXIDANTS) TABS Take 1 tablet by mouth daily.    Historical Provider, MD  Neomycin-Bacitracin-Polymyxin (HCA TRIPLE ANTIBIOTIC OINTMENT EX) Apply 1 application topically See admin instructions. Clean skin tear on left leg daily with soap and water. Then apply triple antibiotic cream and cover with nonstick dressing once daily.    Historical Provider, MD  polyvinyl alcohol (  LIQUIFILM TEARS) 1.4 % ophthalmic solution Place 1 drop into both eyes as needed. Dry eyes    Historical Provider, MD  psyllium (FIBER LAXATIVE) 0.52 G capsule Take 0.52 g by mouth at bedtime.    Historical Provider, MD  QUEtiapine (SEROQUEL) 25 MG tablet Take 12.5 mg by mouth 3 (three) times daily.    Historical Provider, MD  ramipril (ALTACE) 2.5 MG capsule Take 2.5 mg by mouth daily.    Historical Provider, MD  sertraline (ZOLOFT) 25 MG tablet Take 37.5 mg by mouth daily. 1.5 tablet (37.5 mg) daily.    Historical Provider, MD  Skin Protectants, Misc. (EUCERIN) cream Apply 1 application topically 2 (two) times daily. *to arms and legs. Check for bruising and skin tears.    Historical Provider, MD   BP 180/90 mmHg  Pulse 67  Resp  18  SpO2 95% Physical Exam  Constitutional: She is oriented to person, place, and time. She appears well-developed and well-nourished. No distress.  HENT:  Head: Normocephalic.  Nose:    Eyes: Pupils are equal, round, and reactive to light.  Neck: Normal range of motion.  Cardiovascular: Normal rate and intact distal pulses.   Pulmonary/Chest: No respiratory distress.  Abdominal: Normal appearance. She exhibits no distension.  Musculoskeletal: Normal range of motion.       Arms:      Legs: Neurological: She is alert and oriented to person, place, and time. No cranial nerve deficit.  Skin: Skin is warm and dry. No rash noted.  Psychiatric: Her mood appears anxious. She is agitated.  Nursing note and vitals reviewed.   ED Course  Procedures (including critical care time)  Medications  haloperidol lactate (HALDOL) injection 2 mg (2 mg Intramuscular Given 05/25/14 0733)  LORazepam (ATIVAN) injection 1 mg (1 mg Intramuscular Given 05/25/14 0733)   After medication patient became calm or less agitated.  The skin tears and abrasions were cleaned and dressed.  Patient remains at her baseline other than sleepy but is easily arousable.  Her family is at bedside.  At this time no imaging is done. Labs Review Labs Reviewed - No data to display  Imaging Review No results found.    MDM   Final diagnoses:  None        Dot Lanes, MD 05/25/14 [phone]

## 2014-05-25 NOTE — ED Notes (Signed)
Pt less agitated at present time. Sterile saline used to clean left elbow and nose. Steri strips and gauze applied to left elbow. Band-aid applied to nose.

## 2014-05-25 NOTE — ED Notes (Signed)
Pt resting with daughter-in-law at bedside at present time.

## 2014-05-25 NOTE — ED Notes (Signed)
Bed: RESB Expected date:  Expected time:  Means of arrival:  Comments: EMS 81 yo female/fall/nose lac

## 2014-05-25 NOTE — Discharge Instructions (Signed)
Fall Prevention in Hospitals °As a hospital patient, your condition and the treatments you receive can increase your risk for falls. Some additional risk factors for falls in a hospital include: °· Being in an unfamiliar environment. °· Being on bed rest. °· Your surgery. °· Taking certain medicines. °· Your tubing requirements, such as intravenous (IV) therapy or catheters. °It is important that you learn how to decrease fall risks while at the hospital. Below are important tips that can help prevent falls. °SAFETY TIPS FOR PREVENTING FALLS °Talk about your risk of falling. °· Ask your caregiver why you are at risk for falling. Is it your medicine, illness, tubing placement, or something else? °· Make a plan with your caregiver to keep you safe from falls. °· Ask your caregiver or pharmacist about side effect of your medicines. Some medicines can make you dizzy or affect your coordination. °Ask for help. °· Ask for help before getting out of bed. You may need to press your call button. °· Ask for assistance in getting you safely to the toilet. °· Ask for a walker or cane to be put at your bedside. Ask that most of the side rails on your bed be placed up before your caregiver leaves the room. °· Ask family or friends to sit with you. °· Ask for things that are out of your reach, such as your glasses, hearing aids, telephone, bedside table, or call button. °Follow these tips to avoid falling: °· Stay lying or seated, rather than standing, while waiting for help. °· Wear rubber-soled slippers or shoes whenever you walk in the hospital. °· Avoid quick, sudden movements. °¨ Change positions slowly. °¨ Sit on the side of your bed before standing. °¨ Stand up slowly and wait before you start to walk. °· Let your caregiver know if there is a spill on the floor. °· Pay careful attention to the medical equipment, electrical cords, and tubes around you. °· When you need help, use your call button by your bed or in the  bathroom. Wait for one of your caregivers to help you. °· If you feel dizzy or unsure of your footing, return to bed and wait for assistance. °· Avoid being distracted by the TV, telephone, or another person in your room. °· Do not lean or support yourself on rolling objects, such as IV poles or bedside tables. °Document Released: 03/31/2000 Document Revised: 03/20/2012 Document Reviewed: 12/10/2011 °ExitCare® Patient Information ©2015 [employer]. This information is not intended to replace advice given to you by your health care provider. Make sure you discuss any questions you have with your health care provider. ° °

## 2014-05-25 NOTE — ED Notes (Signed)
MD at bedside. 

## 2014-05-25 NOTE — ED Notes (Signed)
Per EMS, pt from, staff states patient tripped from standing position, landed on face, laceration and ecchymosis to nose, bilateral ecchymosis to knees, skin tear to right knee, skin tear to left arm. Staff denies blood thinners for patients, pt will scream if touched. Staff states pt is at mental baseline.

## 2015-10-04 ENCOUNTER — Emergency Department (HOSPITAL_COMMUNITY)
Admission: EM | Admit: 2015-10-04 | Discharge: 2015-10-04 | Disposition: A | Discharge status: Home / Self Care | Payer: Medicare Other | Attending: Emergency Medicine | Admitting: Emergency Medicine

## 2015-10-04 ENCOUNTER — Encounter (HOSPITAL_COMMUNITY): Payer: Self-pay | Admitting: Emergency Medicine

## 2015-10-04 DIAGNOSIS — Z7982 Long term (current) use of aspirin: Secondary | ICD-10-CM | POA: Diagnosis not present

## 2015-10-04 DIAGNOSIS — F0391 Unspecified dementia with behavioral disturbance: Secondary | ICD-10-CM | POA: Insufficient documentation

## 2015-10-04 DIAGNOSIS — I509 Heart failure, unspecified: Secondary | ICD-10-CM | POA: Insufficient documentation

## 2015-10-04 DIAGNOSIS — Z79899 Other long term (current) drug therapy: Secondary | ICD-10-CM | POA: Insufficient documentation

## 2015-10-04 DIAGNOSIS — E785 Hyperlipidemia, unspecified: Secondary | ICD-10-CM | POA: Diagnosis not present

## 2015-10-04 DIAGNOSIS — Z8673 Personal history of transient ischemic attack (TIA), and cerebral infarction without residual deficits: Secondary | ICD-10-CM | POA: Diagnosis not present

## 2015-10-04 DIAGNOSIS — I110 Hypertensive heart disease with heart failure: Secondary | ICD-10-CM | POA: Insufficient documentation

## 2015-10-04 DIAGNOSIS — W19XXXA Unspecified fall, initial encounter: Secondary | ICD-10-CM | POA: Diagnosis not present

## 2015-10-04 NOTE — ED Notes (Signed)
Pt sent from nursing home for evaluation of fall from wheelchair. Towel roll applied by EMS pt denies any pain at this time. No obvious deformities seen on evaluation. No bleeding seen on evaluation.

## 2015-10-04 NOTE — ED Notes (Signed)
Dr.Pickering at bedside to evaluate pt.

## 2015-10-04 NOTE — ED Provider Notes (Signed)
CSN: [id number]     Arrival date & time 10/04/15  2005 History   First MD Initiated Contact with Patient 10/04/15 2045     Chief Complaint  Patient presents with  . Fall      Patient is a 82 y.o. female presenting with fall. The history is provided by the patient.  Fall  Level 5 caveat due to dementia. Patient reportedly had a fall out of her wheelchair. Severe dementia and unable to provide any history. She is not on anticoagulation. Past Medical History  Diagnosis Date  . CHF (congestive heart failure) (HCC)     EF 55-60% on 06/18/09  . HTN (hypertension)   . Alzheimer's dementia     significant dementia  . Stroke [facility])     History of CVA, with resulting R eye blindness  . Blindness of right eye     s/p stroke  . Aortic stenosis   . Hyperlipidemia   . Urinary incontinence     wears diapers at baseline  . Melanoma ([place])   . Frequent falls     several falls, likely mechanical   Past Surgical History  Procedure Laterality Date  . Cholecystectomy    . Partial colectomy     History reviewed. No pertinent family history. Social History  Substance Use Topics  . Smoking status: Never Smoker   . Smokeless tobacco: None  . Alcohol Use: No   OB History    No data available     Review of Systems  Unable to perform ROS: Dementia      Allergies  Percocet  Home Medications   Prior to Admission medications   Medication Sig Start Date End Date Taking? Authorizing Provider  acetaminophen (TYLENOL) 500 MG tablet Take 500 mg by mouth 2 (two) times daily. May take an additional 500mg  twice daily as needed for pain   Yes Historical Provider, MD  Acetic Acid (DOUCHE VINEGAR/WATER) SOLN Place 1 application vaginally once a week. 15 ml with 1 pint warm water.  Vaginal wash.   Yes Historical Provider, MD  aspirin 81 MG chewable tablet Chew 81 mg by mouth every morning.    Yes Historical Provider, MD  Calcium Carbonate-Vitamin D (CALCIUM 600+D) 600-400 MG-UNIT tablet Take 1  tablet by mouth 2 (two) times daily.   Yes Historical Provider, MD  Cranberry 450 MG TABS Take 450 mg by mouth 2 (two) times daily.   Yes Historical Provider, MD  Dextromethorphan-Quinidine (NUEDEXTA) 20-10 MG CAPS Take 1 tablet by mouth every other day. For 4 days then stop   Yes Historical Provider, MD  divalproex (DEPAKOTE SPRINKLE) 125 MG capsule Take 125 mg by mouth 2 (two) times daily.   Yes Historical Provider, MD  docusate sodium (COLACE) 100 MG capsule Take 100 mg by mouth at bedtime.   Yes Historical Provider, MD  GATORADE, BH, Take 240 mLs by mouth daily. Serve room temperature.   Yes Historical Provider, MD  Multiple Vitamins-Minerals (CERTAVITE/ANTIOXIDANTS) TABS Take 1 tablet by mouth daily with breakfast.    Yes Historical Provider, MD  promethazine (PHENERGAN) 25 MG tablet Take 25 mg by mouth every 6 (six) hours as needed for nausea or vomiting.   Yes Historical Provider, MD  protein supplement (RESOURCE BENEPROTEIN) POWD Take 1 scoop by mouth 2 (two) times daily.   Yes Historical Provider, MD  psyllium (FIBER LAXATIVE) 0.52 G capsule Take 0.52 g by mouth at bedtime.   Yes Historical Provider, MD  QUEtiapine (SEROQUEL) 25 MG tablet Take 12.5  mg by mouth at bedtime.    Yes Historical Provider, MD  ramipril (ALTACE) 2.5 MG capsule Take 2.5 mg by mouth daily with breakfast.    Yes Historical Provider, MD  sertraline (ZOLOFT) 25 MG tablet Take 37.5 mg by mouth daily with breakfast.    Yes Historical Provider, MD  Skin Protectants, Misc. (EUCERIN) cream Apply 1 application topically 2 (two) times daily. *to arms and legs. Check for bruising and skin tears.   Yes Historical Provider, MD   BP 156/56 mmHg  Pulse 77  Temp(Src) 98.1 F (36.7 C) (Axillary)  Resp 20  SpO2 97% Physical Exam  Constitutional: She appears well-developed and well-nourished.  HENT:  Head: Atraumatic.  Eyes: EOM are normal.  Neck: Neck supple.  Cardiovascular: Normal rate.   Pulmonary/Chest: Effort normal.   Abdominal: Soft. There is no tenderness.  Musculoskeletal: She exhibits no tenderness.  Neurological:  Patient is at her reported baseline. Somewhat combative physical exam. Pinched a nerve in attempt to spit at her.  Skin: Skin is warm.    ED Course  Procedures (including critical care time) Labs Review Labs Reviewed - No data to display  Imaging Review No results found. I have personally reviewed and evaluated these images and lab results as part of my medical decision-making.   EKG Interpretation None      MDM   Final diagnoses:  Fall, initial encounter  Dementia, with behavioral disturbance    Patient with fall. No apparent    injury. she is at her apparent baseline. No hematoma. No CT at this time. Imaging not needed but if other intra-presents itself with any further imaging. Discharge home.   Davonna Belling, MD 10/04/15 [phone]

## 2015-10-04 NOTE — ED Notes (Signed)
[facility] notified of need for transport.

## 2015-10-04 NOTE — Discharge Instructions (Signed)

## 2015-10-04 NOTE — ED Notes (Signed)
Discharge information given to staff at [facility].

## 2015-10-04 NOTE — ED Notes (Signed)
Bed: WA17 Expected date:  Expected time:  Means of arrival:  Comments: Fall, hematoma to forehead

## 2015-10-04 NOTE — ED Notes (Signed)
[facility] presents with a 82 yo female from [facility] with a fall from a wheelchair reaching for unknown object.  Pt had no LOC, did not hit head and was alert and pinching staff upon [facility] arrival.  Pt denies pain.  Pt has hx of falls reaching for unknown objects from wheelchair.  No blood thinners. Hx of dementia and severe hearing loss.  Pt is aggressive and will hit and pinch.  Non-ambulatory.

## 2015-10-04 NOTE — Progress Notes (Signed)
CSW attempted to speak with pt. However, pt was not effectively communicative. There was no family present. Physician came to bedside to evaluate pt.  Per note, pt presents from [facility] due to falling from her wheelchair.  Willette Brace [id number] ED CSW 10/04/2015 9:25 PM

## 2015-10-13 ENCOUNTER — Emergency Department (HOSPITAL_COMMUNITY): Payer: Medicare Other

## 2015-10-13 ENCOUNTER — Encounter (HOSPITAL_COMMUNITY): Payer: Self-pay | Admitting: Emergency Medicine

## 2015-10-13 ENCOUNTER — Emergency Department (HOSPITAL_COMMUNITY)
Admission: EM | Admit: 2015-10-13 | Discharge: 2015-10-13 | Disposition: A | Discharge status: Home / Self Care | Payer: Medicare Other | Attending: Emergency Medicine | Admitting: Emergency Medicine

## 2015-10-13 DIAGNOSIS — W050XXA Fall from non-moving wheelchair, initial encounter: Secondary | ICD-10-CM | POA: Insufficient documentation

## 2015-10-13 DIAGNOSIS — Z85820 Personal history of malignant melanoma of skin: Secondary | ICD-10-CM | POA: Insufficient documentation

## 2015-10-13 DIAGNOSIS — S0083XA Contusion of other part of head, initial encounter: Secondary | ICD-10-CM | POA: Insufficient documentation

## 2015-10-13 DIAGNOSIS — Y929 Unspecified place or not applicable: Secondary | ICD-10-CM | POA: Diagnosis not present

## 2015-10-13 DIAGNOSIS — I110 Hypertensive heart disease with heart failure: Secondary | ICD-10-CM | POA: Diagnosis not present

## 2015-10-13 DIAGNOSIS — I509 Heart failure, unspecified: Secondary | ICD-10-CM | POA: Insufficient documentation

## 2015-10-13 DIAGNOSIS — Z8673 Personal history of transient ischemic attack (TIA), and cerebral infarction without residual deficits: Secondary | ICD-10-CM | POA: Diagnosis not present

## 2015-10-13 DIAGNOSIS — Z79899 Other long term (current) drug therapy: Secondary | ICD-10-CM | POA: Diagnosis not present

## 2015-10-13 DIAGNOSIS — Y999 Unspecified external cause status: Secondary | ICD-10-CM | POA: Insufficient documentation

## 2015-10-13 DIAGNOSIS — Z7982 Long term (current) use of aspirin: Secondary | ICD-10-CM | POA: Diagnosis not present

## 2015-10-13 DIAGNOSIS — Y939 Activity, unspecified: Secondary | ICD-10-CM | POA: Diagnosis not present

## 2015-10-13 DIAGNOSIS — S0990XA Unspecified injury of head, initial encounter: Secondary | ICD-10-CM | POA: Diagnosis present

## 2015-10-13 DIAGNOSIS — T07XXXA Unspecified multiple injuries, initial encounter: Secondary | ICD-10-CM

## 2015-10-13 NOTE — Discharge Instructions (Signed)
Return to the ER only as needed for any additional problems

## 2015-10-13 NOTE — ED Provider Notes (Signed)
CSN: [id number]     Arrival date & time 10/13/15  1552 History   First MD Initiated Contact with Patient 10/13/15 1558     Chief Complaint  Patient presents with  . Fall      HPI  She presents for evaluation from an extended care facility. Has history of severe dementia. He is in a wheelchair most of the time is not intended. Is not ambulatory. Apparently is minimally verbal. Was witnessed to fall forward out of her wheelchair striking her head against the floor today. Only apparent injuries are to the face and forehead. No blood thinners per the [facility] that accompanies the patient. Was nonverbal en route. History of previous stroke" multiple falls".  Past Medical History  Diagnosis Date  . CHF (congestive heart failure) (HCC)     EF 55-60% on 06/18/09  . HTN (hypertension)   . Alzheimer's dementia     significant dementia  . Stroke [facility])     History of CVA, with resulting R eye blindness  . Blindness of right eye     s/p stroke  . Aortic stenosis   . Hyperlipidemia   . Urinary incontinence     wears diapers at baseline  . Melanoma ([place])   . Frequent falls     several falls, likely mechanical   Past Surgical History  Procedure Laterality Date  . Cholecystectomy    . Partial colectomy     History reviewed. No pertinent family history. Social History  Substance Use Topics  . Smoking status: Never Smoker   . Smokeless tobacco: None  . Alcohol Use: No   OB History    No data available     Review of Systems   Review of systems not obtainable. Level V caveat for dementia and patient nonverbal.   Allergies  Percocet  Home Medications   Prior to Admission medications   Medication Sig Start Date End Date Taking? Authorizing Provider  acetaminophen (TYLENOL) 500 MG tablet Take 500 mg by mouth 2 (two) times daily.    Yes Historical Provider, MD  acetaminophen (TYLENOL) 500 MG tablet Take 500 mg by mouth 2 (two) times daily as needed for mild pain.   Yes Historical  Provider, MD  Acetic Acid (DOUCHE VINEGAR/WATER) SOLN Place 1 application vaginally every Wednesday. 15 ml with 1 pint warm water.  Vaginal wash.   Yes Historical Provider, MD  aspirin 81 MG chewable tablet Chew 81 mg by mouth every morning.    Yes Historical Provider, MD  Calcium Carbonate-Vitamin D (CALCIUM 600+D) 600-400 MG-UNIT tablet Take 1 tablet by mouth 2 (two) times daily.   Yes Historical Provider, MD  Cranberry 450 MG TABS Take 450 mg by mouth 2 (two) times daily.   Yes Historical Provider, MD  divalproex (DEPAKOTE SPRINKLE) 125 MG capsule Take 125 mg by mouth 2 (two) times daily.   Yes Historical Provider, MD  docusate sodium (COLACE) 100 MG capsule Take 100 mg by mouth at bedtime.   Yes Historical Provider, MD  GATORADE, BH, Take 240 mLs by mouth daily. Serve room temperature.   Yes Historical Provider, MD  Multiple Vitamins-Minerals (CERTAVITE/ANTIOXIDANTS) TABS Take 1 tablet by mouth daily with breakfast.    Yes Historical Provider, MD  promethazine (PHENERGAN) 25 MG tablet Take 25 mg by mouth every 6 (six) hours as needed for nausea or vomiting.   Yes Historical Provider, MD  protein supplement (RESOURCE BENEPROTEIN) POWD Take 1 scoop by mouth 2 (two) times daily.   Yes Historical  Provider, MD  psyllium (FIBER LAXATIVE) 0.52 G capsule Take 0.52 g by mouth at bedtime.   Yes Historical Provider, MD  QUEtiapine (SEROQUEL) 25 MG tablet Take 12.5 mg by mouth at bedtime.    Yes Historical Provider, MD  ramipril (ALTACE) 2.5 MG capsule Take 2.5 mg by mouth daily with breakfast.    Yes Historical Provider, MD  sertraline (ZOLOFT) 25 MG tablet Take 37.5 mg by mouth daily with breakfast.    Yes Historical Provider, MD  Skin Protectants, Misc. (EUCERIN) cream Apply 1 application topically 2 (two) times daily. *to arms and legs. Check for bruising and skin tears.   Yes Historical Provider, MD   BP 134/55 mmHg  Pulse 65  Temp(Src) 97.9 F (36.6 C) (Axillary)  Resp 25  SpO2 100% Physical  Exam  Constitutional: No distress.  Eyes open. Apparently alert. Nonverbal. Thin frail-appearing.  HENT:  Head: Normocephalic.    No step-offs. Full extra ocular movements. No blood in the TMs or nares. Tenderness on the right for head, nose, right cheek. No intraoral,. No apparent tenderness to the neck to palpate.  Eyes: Conjunctivae are normal. Pupils are equal, round, and reactive to light. No scleral icterus.  Neck: Normal range of motion. Neck supple. No thyromegaly present.  Cardiovascular: Normal rate and regular rhythm.  Exam reveals no gallop and no friction rub.   No murmur heard. Pulmonary/Chest: Effort normal and breath sounds normal. No respiratory distress. She has no wheezes. She has no rales.  Abdominal: Soft. Bowel sounds are normal. She exhibits no distension. There is no tenderness. There is no rebound.  Musculoskeletal: Normal range of motion.  Neurological: She is alert.  Eyes open and alert. Nonverbal. Will squeeze both hands to command. With assistance will flex and extend at the hip and knee bilaterally.  Skin: Skin is warm and dry. No rash noted.  Psychiatric:  Calm.    ED Course  Procedures (including critical care time) Labs Review Labs Reviewed - No data to display  Imaging Review Ct Head Wo Contrast  10/13/2015  CLINICAL DATA:  Fall from wheelchair with forehead hematoma. Dementia. EXAM: CT HEAD WITHOUT CONTRAST CT MAXILLOFACIAL WITHOUT CONTRAST CT CERVICAL SPINE WITHOUT CONTRAST TECHNIQUE: Multidetector CT imaging of the head, cervical spine, and maxillofacial structures were performed using the standard protocol without intravenous contrast. Multiplanar CT image reconstructions of the cervical spine and maxillofacial structures were also generated. COMPARISON:  05/06/2014 head and cervical spine CTs FINDINGS: CT HEAD FINDINGS Motion degraded, best obtainable given clinical circumstances. Brain: No acute or posttraumatic finding. No evidence of acute  infarction, hemorrhage, hydrocephalus, or mass lesion/mass effect. Atrophy with secondary ventriculomegaly. Confluent ischemic gliosis in the cerebral white matter. Vascular: No hyperdense vessel or unexpected calcification. Skull: Forehead hematoma without fracture. Sinuses/Orbits: See below. CT MAXILLOFACIAL FINDINGS Motion degraded mainly at the level of the mandible. Best obtainable given clinical circumstances. Osseous: No fracture or mandibular dislocation. The patient is edentulous with alveolar ridge atrophy. No destructive process. Orbits: Right cataract resection. No traumatic or inflammatory findings. Sinuses: No hemo sinus. CT CERVICAL SPINE FINDINGS Negative for acute fracture or subluxation. No prevertebral edema. No gross cervical canal hematoma. Lower cervical predominant disc degeneration and left predominant facet arthropathy. Calcified granuloma at the left apex. IMPRESSION: No evidence of intracranial or cervical spine injury. Forehead hematoma without fracture. Electronically Signed   By: Monte Fantasia M.D.   On: 10/13/2015 17:27   Ct Cervical Spine Wo Contrast  10/13/2015  CLINICAL DATA:  Fall from  wheelchair with forehead hematoma. Dementia. EXAM: CT HEAD WITHOUT CONTRAST CT MAXILLOFACIAL WITHOUT CONTRAST CT CERVICAL SPINE WITHOUT CONTRAST TECHNIQUE: Multidetector CT imaging of the head, cervical spine, and maxillofacial structures were performed using the standard protocol without intravenous contrast. Multiplanar CT image reconstructions of the cervical spine and maxillofacial structures were also generated. COMPARISON:  05/06/2014 head and cervical spine CTs FINDINGS: CT HEAD FINDINGS Motion degraded, best obtainable given clinical circumstances. Brain: No acute or posttraumatic finding. No evidence of acute infarction, hemorrhage, hydrocephalus, or mass lesion/mass effect. Atrophy with secondary ventriculomegaly. Confluent ischemic gliosis in the cerebral white matter. Vascular: No  hyperdense vessel or unexpected calcification. Skull: Forehead hematoma without fracture. Sinuses/Orbits: See below. CT MAXILLOFACIAL FINDINGS Motion degraded mainly at the level of the mandible. Best obtainable given clinical circumstances. Osseous: No fracture or mandibular dislocation. The patient is edentulous with alveolar ridge atrophy. No destructive process. Orbits: Right cataract resection. No traumatic or inflammatory findings. Sinuses: No hemo sinus. CT CERVICAL SPINE FINDINGS Negative for acute fracture or subluxation. No prevertebral edema. No gross cervical canal hematoma. Lower cervical predominant disc degeneration and left predominant facet arthropathy. Calcified granuloma at the left apex. IMPRESSION: No evidence of intracranial or cervical spine injury. Forehead hematoma without fracture. Electronically Signed   By: Monte Fantasia M.D.   On: 10/13/2015 17:27   Ct Maxillofacial Wo Contrast  10/13/2015  CLINICAL DATA:  Fall from wheelchair with forehead hematoma. Dementia. EXAM: CT HEAD WITHOUT CONTRAST CT MAXILLOFACIAL WITHOUT CONTRAST CT CERVICAL SPINE WITHOUT CONTRAST TECHNIQUE: Multidetector CT imaging of the head, cervical spine, and maxillofacial structures were performed using the standard protocol without intravenous contrast. Multiplanar CT image reconstructions of the cervical spine and maxillofacial structures were also generated. COMPARISON:  05/06/2014 head and cervical spine CTs FINDINGS: CT HEAD FINDINGS Motion degraded, best obtainable given clinical circumstances. Brain: No acute or posttraumatic finding. No evidence of acute infarction, hemorrhage, hydrocephalus, or mass lesion/mass effect. Atrophy with secondary ventriculomegaly. Confluent ischemic gliosis in the cerebral white matter. Vascular: No hyperdense vessel or unexpected calcification. Skull: Forehead hematoma without fracture. Sinuses/Orbits: See below. CT MAXILLOFACIAL FINDINGS Motion degraded mainly at the level of  the mandible. Best obtainable given clinical circumstances. Osseous: No fracture or mandibular dislocation. The patient is edentulous with alveolar ridge atrophy. No destructive process. Orbits: Right cataract resection. No traumatic or inflammatory findings. Sinuses: No hemo sinus. CT CERVICAL SPINE FINDINGS Negative for acute fracture or subluxation. No prevertebral edema. No gross cervical canal hematoma. Lower cervical predominant disc degeneration and left predominant facet arthropathy. Calcified granuloma at the left apex. IMPRESSION: No evidence of intracranial or cervical spine injury. Forehead hematoma without fracture. Electronically Signed   By: Monte Fantasia M.D.   On: 10/13/2015 17:27   I have personally reviewed and evaluated these images and lab results as part of my medical decision-making.   EKG Interpretation None      MDM   Final diagnoses:  Multiple contusions    Reassuring images. No additional signs of injury on reexamination. Plan will be discharge back her care facility.    Tanna Furry, MD 10/13/15 1734

## 2015-10-13 NOTE — ED Notes (Signed)
Pt from [facility] assisted [facility]. Pt had witnessed fall from wheelchair. Pt with hematoma to right forehead, skin teat to right elbow, bruise to left elbow. Pt has dementia, unknown baseline.

## 2015-12-23 ENCOUNTER — Emergency Department (HOSPITAL_COMMUNITY): Payer: Medicare Other

## 2015-12-23 ENCOUNTER — Encounter (HOSPITAL_COMMUNITY): Payer: Self-pay | Admitting: Emergency Medicine

## 2015-12-23 ENCOUNTER — Emergency Department (HOSPITAL_COMMUNITY)
Admission: EM | Admit: 2015-12-23 | Discharge: 2015-12-24 | Disposition: A | Discharge status: Home / Self Care | Payer: Medicare Other | Attending: Emergency Medicine | Admitting: Emergency Medicine

## 2015-12-23 DIAGNOSIS — I509 Heart failure, unspecified: Secondary | ICD-10-CM | POA: Diagnosis not present

## 2015-12-23 DIAGNOSIS — G309 Alzheimer's disease, unspecified: Secondary | ICD-10-CM | POA: Diagnosis not present

## 2015-12-23 DIAGNOSIS — Y999 Unspecified external cause status: Secondary | ICD-10-CM | POA: Diagnosis not present

## 2015-12-23 DIAGNOSIS — Y939 Activity, unspecified: Secondary | ICD-10-CM | POA: Insufficient documentation

## 2015-12-23 DIAGNOSIS — Z79899 Other long term (current) drug therapy: Secondary | ICD-10-CM | POA: Diagnosis not present

## 2015-12-23 DIAGNOSIS — S81812A Laceration without foreign body, left lower leg, initial encounter: Secondary | ICD-10-CM | POA: Diagnosis not present

## 2015-12-23 DIAGNOSIS — Z7982 Long term (current) use of aspirin: Secondary | ICD-10-CM | POA: Diagnosis not present

## 2015-12-23 DIAGNOSIS — W050XXA Fall from non-moving wheelchair, initial encounter: Secondary | ICD-10-CM | POA: Insufficient documentation

## 2015-12-23 DIAGNOSIS — S0990XA Unspecified injury of head, initial encounter: Secondary | ICD-10-CM | POA: Diagnosis present

## 2015-12-23 DIAGNOSIS — S0083XA Contusion of other part of head, initial encounter: Secondary | ICD-10-CM | POA: Diagnosis not present

## 2015-12-23 DIAGNOSIS — Y92129 Unspecified place in nursing home as the place of occurrence of the external cause: Secondary | ICD-10-CM | POA: Insufficient documentation

## 2015-12-23 DIAGNOSIS — I110 Hypertensive heart disease with heart failure: Secondary | ICD-10-CM | POA: Insufficient documentation

## 2015-12-23 DIAGNOSIS — Z85820 Personal history of malignant melanoma of skin: Secondary | ICD-10-CM | POA: Diagnosis not present

## 2015-12-23 DIAGNOSIS — S81811A Laceration without foreign body, right lower leg, initial encounter: Secondary | ICD-10-CM | POA: Diagnosis not present

## 2015-12-23 DIAGNOSIS — T148XXA Other injury of unspecified body region, initial encounter: Secondary | ICD-10-CM

## 2015-12-23 MED ORDER — HALOPERIDOL LACTATE 5 MG/ML IJ SOLN
2.0000 mg | Freq: Once | INTRAMUSCULAR | Status: AC
  Administered 2015-12-23: 2 mg via INTRAVENOUS

## 2015-12-23 MED ORDER — HALOPERIDOL LACTATE 5 MG/ML IJ SOLN
2.0000 mg | Freq: Once | INTRAMUSCULAR | Status: AC
  Administered 2015-12-23: 2 mg via INTRAVENOUS
  Filled 2015-12-23: qty 1

## 2015-12-23 MED ORDER — HYDROGEN PEROXIDE 3 % EX SOLN
CUTANEOUS | Status: AC
  Filled 2015-12-23: qty 473

## 2015-12-23 NOTE — ED Notes (Signed)
Pt to CT

## 2015-12-23 NOTE — ED Notes (Signed)
Bed: RESB Expected date:  Expected time:  Means of arrival:  Comments: 82 yr old fall

## 2015-12-23 NOTE — ED Triage Notes (Signed)
Pt here via EMS with complaints of fall and head laceration. Per facility pt fell from her wheelchair sustaining a head laceration and two skin tears on her legs. Bleeding is controlled at this time. Per facility pt is at baseline. Pt is a memory care pt. Pt does not take blood thinners. Pt was put in a neck brace by EMS staff

## 2015-12-23 NOTE — ED Notes (Signed)
[place] pt's son called to check on pt. Pt would like to be contacted with updates

## 2015-12-23 NOTE — ED Provider Notes (Signed)
[facility] DEPT Provider Note   CSN: [id number] Arrival date & time: 12/23/15  1949     History   Chief Complaint Chief Complaint  Patient presents with  . Fall  . Head Laceration    HPI Zona Michalik is a 82 y.o. female.  82 year old female with past medical history including Alzheimer's dementia, CHF, frequent falls who presents with fall and head injury. EMS reports that the patient was brought from her facility where she fell out of her wheelchair and struck her head. She is at her neurologic baseline according to staff. She does not take any blood thinners.  LEVEL 5 CAVEAT DUE TO DEMENTIA   The history is provided by the EMS personnel.  Fall   Head Laceration     Past Medical History:  Diagnosis Date  . Alzheimer's dementia    significant dementia  . Aortic stenosis   . Blindness of right eye    s/p stroke  . CHF (congestive heart failure) (HCC)    EF 55-60% on 06/18/09  . Frequent falls    several falls, likely mechanical  . HTN (hypertension)   . Hyperlipidemia   . Melanoma ([place])   . Stroke [facility])    History of CVA, with resulting R eye blindness  . Urinary incontinence    wears diapers at baseline    Patient Active Problem List   Diagnosis Date Noted  . Urinary tract infection 02/20/2011  . CHF (congestive heart failure) ([place])   . HTN (hypertension)   . Alzheimer's dementia   . Stroke ([place])   . Blindness of right eye   . Aortic stenosis   . Hyperlipidemia   . Urinary incontinence   . Melanoma ([place])   . Frequent falls     Past Surgical History:  Procedure Laterality Date  . CHOLECYSTECTOMY    . PARTIAL COLECTOMY      OB History    No data available       Home Medications    Prior to Admission medications   Medication Sig Start Date End Date Taking? Authorizing Provider  acetaminophen (TYLENOL) 500 MG tablet Take 500 mg by mouth 2 (two) times daily as needed for mild pain.   Yes Historical Provider, MD  Acetic Acid (DOUCHE  VINEGAR/WATER) SOLN Place 1 application vaginally every Wednesday. 15 ml with 1 pint warm water.  Vaginal wash.   Yes Historical Provider, MD  aspirin 81 MG chewable tablet Chew 81 mg by mouth every morning.    Yes Historical Provider, MD  Calcium Carbonate-Vitamin D (CALCIUM 600+D) 600-400 MG-UNIT tablet Take 1 tablet by mouth 2 (two) times daily.   Yes Historical Provider, MD  Cranberry 450 MG TABS Take 450 mg by mouth 2 (two) times daily.   Yes Historical Provider, MD  divalproex (DEPAKOTE SPRINKLE) 125 MG capsule Take 125 mg by mouth 2 (two) times daily.   Yes Historical Provider, MD  docusate sodium (COLACE) 100 MG capsule Take 100 mg by mouth at bedtime.   Yes Historical Provider, MD  GATORADE, BH, Take 240 mLs by mouth daily. Serve room temperature.   Yes Historical Provider, MD  Multiple Vitamins-Minerals (CERTAVITE/ANTIOXIDANTS) TABS Take 1 tablet by mouth daily with breakfast.    Yes Historical Provider, MD  promethazine (PHENERGAN) 25 MG tablet Take 25 mg by mouth every 6 (six) hours as needed for nausea or vomiting.   Yes Historical Provider, MD  protein supplement (RESOURCE BENEPROTEIN) POWD Take 1 scoop by mouth 2 (two) times daily.  Yes Historical Provider, MD  psyllium (FIBER LAXATIVE) 0.52 G capsule Take 0.52 g by mouth at bedtime.   Yes Historical Provider, MD  QUEtiapine (SEROQUEL) 25 MG tablet Take 12.5 mg by mouth at bedtime.    Yes Historical Provider, MD  ramipril (ALTACE) 2.5 MG capsule Take 2.5 mg by mouth daily with breakfast.    Yes Historical Provider, MD  sertraline (ZOLOFT) 25 MG tablet Take 75 mg by mouth daily with breakfast.    Yes Historical Provider, MD  Skin Protectants, Misc. (EUCERIN) cream Apply 1 application topically 2 (two) times daily. *to arms and legs. Check for bruising and skin tears.   Yes Historical Provider, MD    Family History No family history on file.  Social History Social History  Substance Use Topics  . Smoking status: Never Smoker  .  Smokeless tobacco: Never Used  . Alcohol use No     Allergies   Percocet [oxycodone-acetaminophen]   Review of Systems Review of Systems  Unable to perform ROS: Dementia     Physical Exam Updated Vital Signs BP 155/99 (BP Location: Right Arm)   Pulse 71   Temp 97.6 F (36.4 C) (Axillary)   Resp 16   SpO2 96%   Physical Exam  Constitutional: No distress.  Thin, frail elderly woman, awake, alert  HENT:  Head: Normocephalic.  Hematoma R forehead near hairline w/ punctate laceration  Eyes: Conjunctivae are normal. Pupils are equal, round, and reactive to light.  Neck: Neck supple.  Cardiovascular: Normal rate and regular rhythm.   Murmur heard.  Systolic murmur is present with a grade of 4/6  Pulmonary/Chest: Effort normal and breath sounds normal.  Abdominal: Soft. Bowel sounds are normal. She exhibits no distension. There is no tenderness.  Musculoskeletal: She exhibits no edema.  Neurological: She is alert.  Unable to follow commands Moving all 4 extremities  Skin: Skin is warm and dry.  Multiple new and old skin tears on legs  Psychiatric:  Screaming, agitated  Nursing note and vitals reviewed.    ED Treatments / Results  Labs (all labs ordered are listed, but only abnormal results are displayed) Labs Reviewed - No data to display  EKG  EKG Interpretation None       Radiology Ct Head Wo Contrast  Result Date: 12/23/2015 FALL CLINICAL DATA: With head laceration.  Dementia. EXAM: CT HEAD WITHOUT CONTRAST CT CERVICAL SPINE WITHOUT CONTRAST TECHNIQUE: Multidetector CT imaging of the head and cervical spine was performed following the standard protocol without intravenous contrast. Multiplanar CT image reconstructions of the cervical spine were also generated. COMPARISON:  10/13/2015 FINDINGS: CT HEAD FINDINGS Brain: 3 mm left thalamic lacunar infarct. Small remote lacunar infarct in the left cerebellum, image 8/3. Periventricular white matter and corona  radiata hypodensities favor chronic ischemic microvascular white matter disease. Otherwise, The brainstem, cerebellum, cerebral peduncles, thalami, basal ganglia, basilar cisterns, and ventricular system appear within normal limits. No intracranial hemorrhage, mass lesion, or acute CVA. Vascular: There is atherosclerotic calcification of the cavernous carotid arteries bilaterally. Skull: Unremarkable Sinuses/Orbits: Sinuses unremarkable. Calcification along the expected location of the right lesions along with some calcifications along the anterior chamber the right globe. These are unchanged. Other: Right frontal scalp hematoma along the forehead. CT CERVICAL SPINE FINDINGS Alignment: 2 mm degenerative anterolisthesis at C4-5. Skull base and vertebrae: No fracture identified. Faintly calcified pannus posterior to the odontoid. Multilevel intervertebral spurring. Loss of disc height at C5-6 and C6-7. Soft tissues and spinal canal: Unremarkable Disc levels:  Osseous foraminal narrowing on the left at C3-4, C5-6, and C6-7; and on the right at C5-6, due to uncinate and facet spurring. Calcified disc protrusion at C5-6 potentially causing moderate central narrowing of the thecal sac, given the calcification this is likely chronic Upper chest: Atherosclerotic calcification of the branch vessels and aortic arch. Calcified pulmonary nodules in the left upper lobe, likely granulomata. There is at least a moderate right and small left pleural effusion, only partially included on today' s exam. Other: No supplemental non-categorized findings. IMPRESSION: 1. Head CT reveals no acute intracranial findings. There is evidence of chronic microvascular white matter disease in several small remote lacunar infarcts. Right frontal scalp hematoma noted. 2. Cervical spine CT reveals no fracture or acute subluxation. There is multilevel osseous foraminal stenosis due to spondylosis, as well as evidence of atherosclerosis, old  granulomatous disease, and degenerative disc disease. 3. We partially image a moderate right and small left pleural effusion. Electronically Signed   By: Van Clines M.D.   On: 12/23/2015 21:42   Ct Cervical Spine Wo Contrast  Result Date: 12/23/2015 FALL CLINICAL DATA: With head laceration.  Dementia. EXAM: CT HEAD WITHOUT CONTRAST CT CERVICAL SPINE WITHOUT CONTRAST TECHNIQUE: Multidetector CT imaging of the head and cervical spine was performed following the standard protocol without intravenous contrast. Multiplanar CT image reconstructions of the cervical spine were also generated. COMPARISON:  10/13/2015 FINDINGS: CT HEAD FINDINGS Brain: 3 mm left thalamic lacunar infarct. Small remote lacunar infarct in the left cerebellum, image 8/3. Periventricular white matter and corona radiata hypodensities favor chronic ischemic microvascular white matter disease. Otherwise, The brainstem, cerebellum, cerebral peduncles, thalami, basal ganglia, basilar cisterns, and ventricular system appear within normal limits. No intracranial hemorrhage, mass lesion, or acute CVA. Vascular: There is atherosclerotic calcification of the cavernous carotid arteries bilaterally. Skull: Unremarkable Sinuses/Orbits: Sinuses unremarkable. Calcification along the expected location of the right lesions along with some calcifications along the anterior chamber the right globe. These are unchanged. Other: Right frontal scalp hematoma along the forehead. CT CERVICAL SPINE FINDINGS Alignment: 2 mm degenerative anterolisthesis at C4-5. Skull base and vertebrae: No fracture identified. Faintly calcified pannus posterior to the odontoid. Multilevel intervertebral spurring. Loss of disc height at C5-6 and C6-7. Soft tissues and spinal canal: Unremarkable Disc levels: Osseous foraminal narrowing on the left at C3-4, C5-6, and C6-7; and on the right at C5-6, due to uncinate and facet spurring. Calcified disc protrusion at C5-6 potentially  causing moderate central narrowing of the thecal sac, given the calcification this is likely chronic Upper chest: Atherosclerotic calcification of the branch vessels and aortic arch. Calcified pulmonary nodules in the left upper lobe, likely granulomata. There is at least a moderate right and small left pleural effusion, only partially included on today' s exam. Other: No supplemental non-categorized findings. IMPRESSION: 1. Head CT reveals no acute intracranial findings. There is evidence of chronic microvascular white matter disease in several small remote lacunar infarcts. Right frontal scalp hematoma noted. 2. Cervical spine CT reveals no fracture or acute subluxation. There is multilevel osseous foraminal stenosis due to spondylosis, as well as evidence of atherosclerosis, old granulomatous disease, and degenerative disc disease. 3. We partially image a moderate right and small left pleural effusion. Electronically Signed   By: Van Clines M.D.   On: 12/23/2015 21:42    Procedures Procedures (including critical care time)  Medications Ordered in ED Medications  hydrogen peroxide 3 % external solution (not administered)  haloperidol lactate (HALDOL) injection 2 mg (not  administered)  haloperidol lactate (HALDOL) injection 2 mg (2 mg Intravenous Given 12/23/15 2324)  haloperidol lactate (HALDOL) injection 2 mg (2 mg Intravenous Given 12/23/15 2338)     Initial Impression / Assessment and Plan / ED Course  I have reviewed the triage vital signs and the nursing notes.  Pertinent imaging results that were available during my care of the patient were reviewed by me and considered in my medical decision making (see chart for details).  Clinical Course   PT w/ frequent falls brought in from nursing facility w/ fall from wheelchair. She was awakened at her neurologic baseline according to staff. CT of head and C-spine negative for acute injury. Vital signs reassuring. Gave haldol for severe  agitation in order to clean and dress wounds. Extremity wounds were skin tears and forehead wound did not require any suturing. I reviewed chart and patient appears to be at neurologic baseline. She will be discharged back to nursing facility.  Final Clinical Impressions(s) / ED Diagnoses   Final diagnoses:  Forehead contusion, initial encounter  Multiple skin tears    New Prescriptions New Prescriptions   No medications on file     Sharlett Iles, MD 12/24/15 0128

## 2015-12-24 DIAGNOSIS — S81812A Laceration without foreign body, left lower leg, initial encounter: Secondary | ICD-10-CM | POA: Diagnosis not present

## 2015-12-24 MED ORDER — HALOPERIDOL LACTATE 5 MG/ML IJ SOLN
2.0000 mg | Freq: Once | INTRAMUSCULAR | Status: AC
  Administered 2015-12-24: 2 mg via INTRAVENOUS
  Filled 2015-12-24: qty 1

## 2016-02-12 ENCOUNTER — Emergency Department (HOSPITAL_COMMUNITY): Payer: Medicare Other

## 2016-02-12 ENCOUNTER — Emergency Department (HOSPITAL_COMMUNITY)
Admission: EM | Admit: 2016-02-12 | Discharge: 2016-02-12 | Disposition: A | Discharge status: Home / Self Care | Payer: Medicare Other | Attending: Emergency Medicine | Admitting: Emergency Medicine

## 2016-02-12 ENCOUNTER — Encounter (HOSPITAL_COMMUNITY): Payer: Self-pay

## 2016-02-12 DIAGNOSIS — I110 Hypertensive heart disease with heart failure: Secondary | ICD-10-CM | POA: Insufficient documentation

## 2016-02-12 DIAGNOSIS — Z23 Encounter for immunization: Secondary | ICD-10-CM | POA: Insufficient documentation

## 2016-02-12 DIAGNOSIS — S0181XA Laceration without foreign body of other part of head, initial encounter: Secondary | ICD-10-CM | POA: Insufficient documentation

## 2016-02-12 DIAGNOSIS — I509 Heart failure, unspecified: Secondary | ICD-10-CM | POA: Diagnosis not present

## 2016-02-12 DIAGNOSIS — G309 Alzheimer's disease, unspecified: Secondary | ICD-10-CM | POA: Diagnosis not present

## 2016-02-12 DIAGNOSIS — Y929 Unspecified place or not applicable: Secondary | ICD-10-CM | POA: Diagnosis not present

## 2016-02-12 DIAGNOSIS — Z8673 Personal history of transient ischemic attack (TIA), and cerebral infarction without residual deficits: Secondary | ICD-10-CM | POA: Diagnosis not present

## 2016-02-12 DIAGNOSIS — Y999 Unspecified external cause status: Secondary | ICD-10-CM | POA: Diagnosis not present

## 2016-02-12 DIAGNOSIS — Z7982 Long term (current) use of aspirin: Secondary | ICD-10-CM | POA: Diagnosis not present

## 2016-02-12 DIAGNOSIS — Y939 Activity, unspecified: Secondary | ICD-10-CM | POA: Diagnosis not present

## 2016-02-12 DIAGNOSIS — W19XXXA Unspecified fall, initial encounter: Secondary | ICD-10-CM

## 2016-02-12 MED ORDER — CEPHALEXIN 500 MG PO CAPS: 500.0000 mg | ORAL_CAPSULE | Freq: Four times a day (QID) | ORAL | 0 refills | Status: AC

## 2016-02-12 MED ORDER — LIDOCAINE-EPINEPHRINE-TETRACAINE (LET) SOLUTION
3.0000 mL | Freq: Once | NASAL | Status: AC
  Administered 2016-02-12: 3 mL via TOPICAL
  Filled 2016-02-12: qty 3

## 2016-02-12 MED ORDER — TETANUS-DIPHTH-ACELL PERTUSSIS 5-2.5-18.5 LF-MCG/0.5 IM SUSP
0.5000 mL | Freq: Once | INTRAMUSCULAR | Status: AC
  Administered 2016-02-12: 0.5 mL via INTRAMUSCULAR
  Filled 2016-02-12: qty 0.5

## 2016-02-12 MED ORDER — LIDOCAINE HCL (PF) 1 % IJ SOLN
30.0000 mL | Freq: Once | INTRAMUSCULAR | Status: AC
  Administered 2016-02-12: 30 mL via INTRADERMAL
  Filled 2016-02-12: qty 30

## 2016-02-12 NOTE — ED Provider Notes (Signed)
[facility] DEPT Provider Note   CSN: [id number] Arrival date & time: 02/12/16  1748     History   Chief Complaint Chief Complaint  Patient presents with  . Fall    HPI   Blood pressure (!) 104/36, pulse 81, temperature 98.3 F (36.8 C), temperature source Axillary, resp. rate 16, SpO2 97 %.  Ruth [name] is a 82 y.o. female with past medical history significant for Alzheimer's, aortic stenosis, CHF, CVA) from SNF for evaluation of fall. Patient fell out of her wheelchair forward onto her head area large laceration forehead. She is mentating at her baseline as per staff, minimally verbal and aggressive. Level V caveat secondary to dementia. Chart review shows no anticoagulation. Son is at bedside confirms that patient is mentating at her baseline.   Past Medical History:  Diagnosis Date  . Alzheimer's dementia    significant dementia  . Aortic stenosis   . Blindness of right eye    s/p stroke  . CHF (congestive heart failure) (HCC)    EF 55-60% on 06/18/09  . Frequent falls    several falls, likely mechanical  . HTN (hypertension)   . Hyperlipidemia   . Melanoma ([place])   . Stroke [facility])    History of CVA, with resulting R eye blindness  . Urinary incontinence    wears diapers at baseline    Patient Active Problem List   Diagnosis Date Noted  . Urinary tract infection 02/20/2011  . CHF (congestive heart failure) ([place])   . HTN (hypertension)   . Alzheimer's dementia   . Stroke ([place])   . Blindness of right eye   . Aortic stenosis   . Hyperlipidemia   . Urinary incontinence   . Melanoma ([place])   . Frequent falls     Past Surgical History:  Procedure Laterality Date  . CHOLECYSTECTOMY    . PARTIAL COLECTOMY      OB History    No data available       Home Medications    Prior to Admission medications   Medication Sig Start Date End Date Taking? Authorizing Provider  acetaminophen (TYLENOL) 500 MG tablet Take 500 mg by mouth 2 (two) times daily as  needed for mild pain.    Historical Provider, MD  Acetic Acid (DOUCHE VINEGAR/WATER) SOLN Place 1 application vaginally every Wednesday. 15 ml with 1 pint warm water.  Vaginal wash.    Historical Provider, MD  aspirin 81 MG chewable tablet Chew 81 mg by mouth every morning.     Historical Provider, MD  Calcium Carbonate-Vitamin D (CALCIUM 600+D) 600-400 MG-UNIT tablet Take 1 tablet by mouth 2 (two) times daily.    Historical Provider, MD  cephALEXin (KEFLEX) 500 MG capsule Take 1 capsule (500 mg total) by mouth 4 (four) times daily. 02/12/16   Britley Gashi, PA-C  Cranberry 450 MG TABS Take 450 mg by mouth 2 (two) times daily.    Historical Provider, MD  divalproex (DEPAKOTE SPRINKLE) 125 MG capsule Take 125 mg by mouth 2 (two) times daily.    Historical Provider, MD  docusate sodium (COLACE) 100 MG capsule Take 100 mg by mouth at bedtime.    Historical Provider, MD  GATORADE, BH, Take 240 mLs by mouth daily. Serve room temperature.    Historical Provider, MD  Multiple Vitamins-Minerals (CERTAVITE/ANTIOXIDANTS) TABS Take 1 tablet by mouth daily with breakfast.     Historical Provider, MD  promethazine (PHENERGAN) 25 MG tablet Take 25 mg by mouth every 6 (six) hours  as needed for nausea or vomiting.    Historical Provider, MD  protein supplement (RESOURCE BENEPROTEIN) POWD Take 1 scoop by mouth 2 (two) times daily.    Historical Provider, MD  psyllium (FIBER LAXATIVE) 0.52 G capsule Take 0.52 g by mouth at bedtime.    Historical Provider, MD  QUEtiapine (SEROQUEL) 25 MG tablet Take 12.5 mg by mouth at bedtime.     Historical Provider, MD  ramipril (ALTACE) 2.5 MG capsule Take 2.5 mg by mouth daily with breakfast.     Historical Provider, MD  sertraline (ZOLOFT) 25 MG tablet Take 75 mg by mouth daily with breakfast.     Historical Provider, MD  Skin Protectants, Misc. (EUCERIN) cream Apply 1 application topically 2 (two) times daily. *to arms and legs. Check for bruising and skin tears.     Historical Provider, MD    Family History History reviewed. No pertinent family history.  Social History Social History  Substance Use Topics  . Smoking status: Never Smoker  . Smokeless tobacco: Never Used  . Alcohol use No     Allergies   Percocet [oxycodone-acetaminophen]   Review of Systems Review of Systems  Unable to perform ROS: Dementia       Physical Exam Updated Vital Signs BP (!) 100/44   Pulse 83   Temp 98.3 F (36.8 C) (Axillary)   Resp 22   SpO2 94%   Physical Exam  Constitutional: She appears well-developed and well-nourished. No distress.  HENT:  Head: Normocephalic.  Mouth/Throat: Oropharynx is clear and moist.  2 cm stellate laceration to right forehead with calvarium exposed.  Eyes: Conjunctivae and EOM are normal. Pupils are equal, round, and reactive to light.  Neck: Normal range of motion.  Cardiovascular: Normal rate, regular rhythm and intact distal pulses.   Pulmonary/Chest: Effort normal and breath sounds normal.  Abdominal: Soft. There is no tenderness.  Musculoskeletal: Normal range of motion.  Abrasion to posterior left shoulder.  Neurological:  Nonverbal, moans to noxious stimuli. Moving all extremities.  Skin: She is not diaphoretic.  Psychiatric: She has a normal mood and affect.  Nursing note and vitals reviewed.    ED Treatments / Results  Labs (all labs ordered are listed, but only abnormal results are displayed) Labs Reviewed - No data to display  EKG  EKG Interpretation None       Radiology Ct Head Wo Contrast  Result Date: 02/12/2016 CLINICAL DATA:  Fall out of wheelchair, dementia, history of stroke EXAM: CT HEAD WITHOUT CONTRAST CT CERVICAL SPINE WITHOUT CONTRAST TECHNIQUE: Multidetector CT imaging of the head and cervical spine was performed following the standard protocol without intravenous contrast. Multiplanar CT image reconstructions of the cervical spine were also generated. COMPARISON:  12/23/2015  FINDINGS: CT HEAD FINDINGS Brain: No evidence of acute infarction, hemorrhage, hydrocephalus, extra-axial collection or mass lesion/mass effect. Vascular: No hyperdense vessel or unexpected calcification. Skull: Normal. Negative for fracture or focal lesion. Sinuses/Orbits: Mild mucosal thickening of the bilateral ethmoid sinuses. Partial opacification of the left mastoid air cells. Other: Global cortical atrophy.  Secondary ventriculomegaly. Extensive subcortical white matter and periventricular small vessel ischemic changes. Intracranial atherosclerosis. CT CERVICAL SPINE FINDINGS Alignment: Straightening of the normal cervical lordosis. Skull base and vertebrae: No acute fracture. No primary bone lesion or focal pathologic process. Soft tissues and spinal canal: No prevertebral fluid or swelling. No visible canal hematoma. Disc levels: Moderate focal degenerative changes. Spinal canal remains patent. Upper chest: Visualized lung apices are notable for a possible small  right pleural effusion. Other: Visualized thyroid is grossly unremarkable. IMPRESSION: No evidence of acute intracranial abnormality. Atrophy with small vessel ischemic changes. No evidence of traumatic injury to the cervical spine. Moderate multilevel degenerative changes. Electronically Signed   By: Julian Hy M.D.   On: 02/12/2016 19:26   Ct Cervical Spine Wo Contrast  Result Date: 02/12/2016 CLINICAL DATA:  Fall out of wheelchair, dementia, history of stroke EXAM: CT HEAD WITHOUT CONTRAST CT CERVICAL SPINE WITHOUT CONTRAST TECHNIQUE: Multidetector CT imaging of the head and cervical spine was performed following the standard protocol without intravenous contrast. Multiplanar CT image reconstructions of the cervical spine were also generated. COMPARISON:  12/23/2015 FINDINGS: CT HEAD FINDINGS Brain: No evidence of acute infarction, hemorrhage, hydrocephalus, extra-axial collection or mass lesion/mass effect. Vascular: No hyperdense  vessel or unexpected calcification. Skull: Normal. Negative for fracture or focal lesion. Sinuses/Orbits: Mild mucosal thickening of the bilateral ethmoid sinuses. Partial opacification of the left mastoid air cells. Other: Global cortical atrophy.  Secondary ventriculomegaly. Extensive subcortical white matter and periventricular small vessel ischemic changes. Intracranial atherosclerosis. CT CERVICAL SPINE FINDINGS Alignment: Straightening of the normal cervical lordosis. Skull base and vertebrae: No acute fracture. No primary bone lesion or focal pathologic process. Soft tissues and spinal canal: No prevertebral fluid or swelling. No visible canal hematoma. Disc levels: Moderate focal degenerative changes. Spinal canal remains patent. Upper chest: Visualized lung apices are notable for a possible small right pleural effusion. Other: Visualized thyroid is grossly unremarkable. IMPRESSION: No evidence of acute intracranial abnormality. Atrophy with small vessel ischemic changes. No evidence of traumatic injury to the cervical spine. Moderate multilevel degenerative changes. Electronically Signed   By: Julian Hy M.D.   On: 02/12/2016 19:26   Ct Shoulder Left Wo Contrast  Result Date: 02/12/2016 CLINICAL DATA:  Pain following head first fall about a wheelchair EXAM: CT OF THE LEFT SHOULDER WITHOUT CONTRAST TECHNIQUE: Multidetector CT imaging was performed according to the standard protocol. Multiplanar CT image reconstructions were also generated. COMPARISON:  02/12/2016 CT FINDINGS: There is no acute displaced fracture identified allowing for osteopenia. There is no dislocation of the left humeral head. Degenerative changes involving the inferior glenohumeral joint with bony spurring present. Moderate degenerative changes of left AC joint with irregularity, osteophyte and small pannus. Multiple tendinous calcifications within the rotator cuff. Mild loss of the subacromial space. There is no scapular  fracture or clavicular fracture. The left upper ribs appear intact. Degenerative changes of the partially visualized cough substernal joints. No axillary adenopathy. Tiny gas bubbles in the subcutaneous of the left shoulder. Lung apices demonstrate multiple calcified granulomas. IMPRESSION: 1. No acute fracture or malalignment of the left shoulder. 2. Calcific tendinitis 3. Moderate left AC joint degenerative change 4. Tiny gas bubbles in the subcutaneous of the left shoulder, correlate clinically for any history of recent injection. 5. Calcified left upper lobe lung nodules. Electronically Signed   By: Donavan Foil M.D.   On: 02/12/2016 19:50    Procedures .Marland KitchenLaceration Repair Date/Time: 02/12/2016 9:07 PM Performed by: Monico Blitz Authorized by: Monico Blitz   Anesthesia (see MAR for exact dosages):    Anesthesia method:  Topical application and local infiltration   Topical anesthetic:  LET   Local anesthetic:  Lidocaine 1% w/o epi (4) Laceration details:    Location: FOrehead    Length (cm):  2 Repair type:    Repair type:  Complex Pre-procedure details:    Preparation:  Patient was prepped and draped in usual sterile fashion Exploration:  Contaminated: no   Treatment:    Area cleansed with:  Saline   Amount of cleaning:  Extensive   Irrigation solution:  Sterile saline   Irrigation volume:  100CC   Irrigation method:  Pressure wash and syringe   Visualized foreign bodies/material removed: no   Skin repair:    Repair method:  Sutures   Suture size:  4-0   Wound skin closure material used: Ethilon    Number of sutures:  11 Approximation:    Approximation:  Loose Post-procedure details:    Patient tolerance of procedure:  Tolerated well, no immediate complications   (including critical care time)  Medications Ordered in ED Medications  lidocaine-EPINEPHrine-tetracaine (LET) solution (3 mLs Topical Given by Other 02/12/16 1837)  lidocaine (PF) (XYLOCAINE) 1 %  injection 30 mL (30 mLs Intradermal Given by Other 02/12/16 1837)  Tdap (BOOSTRIX) injection 0.5 mL (0.5 mLs Intramuscular Given 02/12/16 1835)     Initial Impression / Assessment and Plan / ED Course  I have reviewed the triage vital signs and the nursing notes.  Pertinent labs & imaging results that were available during my care of the patient were reviewed by me and considered in my medical decision making (see chart for details).  Clinical Course    Vitals:   02/12/16 1815 02/12/16 1830 02/12/16 1845 02/12/16 2000  BP: (!) 117/44 99/55 (!) 100/44   Pulse: 77 83 80 83  Resp: 22 19 (!) 27 22  Temp:      TempSrc:      SpO2: 98% 97% 100% 94%    Medications  lidocaine-EPINEPHrine-tetracaine (LET) solution (3 mLs Topical Given by Other 02/12/16 1837)  lidocaine (PF) (XYLOCAINE) 1 % injection 30 mL (30 mLs Intradermal Given by Other 02/12/16 1837)  Tdap (BOOSTRIX) injection 0.5 mL (0.5 mLs Intramuscular Given 02/12/16 1835)    Castiel Chilcote is 82 y.o. female presenting with Facial laceration status post fall, she has severe dementia but this appears to be at her baseline. Grossly nonfocal neurologic exam. CTs with no acute abnormality, they do note air bubbles in the left shoulder however she received a tetanus shot in this area. Wound is cleaned and closed.  This is a shared visit with the attending physician who personally evaluated the patient and agrees with the care plan.   Evaluation does not show pathology that would require ongoing emergent intervention or inpatient treatment. Pt is hemodynamically stable and mentating appropriately. Discussed findings and plan with patient/guardian, who agrees with care plan. All questions answered. Return precautions discussed and outpatient follow up given.      Final Clinical Impressions(s) / ED Diagnoses   Final diagnoses:  Fall, initial encounter  Facial laceration, initial encounter    New Prescriptions New Prescriptions    CEPHALEXIN (KEFLEX) 500 MG CAPSULE    Take 1 capsule (500 mg total) by mouth 4 (four) times daily.     Monico Blitz, PA-C 02/12/16 2114    Leonard Schwartz, MD 02/13/16 2028

## 2016-02-12 NOTE — ED Notes (Signed)
Patient in CT

## 2016-02-12 NOTE — Discharge Instructions (Signed)
Keep wound dry and do not remove dressing for 24 hours if possible. After that, wash gently morning and night (every 12 hours) with soap and water. Use a topical antibiotic ointment and cover with a bandaid or gauze.  °  °Do NOT use rubbing alcohol or hydrogen peroxide, do not soak the area °  °Present to your primary care doctor or the urgent care of your choice, or the ED for suture removal in 10 days. °  °Every attempt was made to remove foreign body (contaminants) from the wound.  However, there is always a chance that some may remain in the wound. This can  increase your risk of infection. °  °If you see signs of infection (warmth, redness, tenderness, pus, sharp increase in pain, fever, red streaking in the skin) immediately return to the emergency department. °  ° ° °

## 2016-02-12 NOTE — ED Triage Notes (Signed)
Patient comes from [facility] assisted [facility] by EMS for a fall.  Patient fell headfirst out of wheelchair and has a 3cm X 3cm laceration to her forehead.  Confused and combative per her baseline.  Vitals were stable.

## 2016-05-18 DEATH — deceased

## 2018-03-13 IMAGING — CT CT HEAD W/O CM
5 of 8 series · 17 of 47 positions shown, 18 images · non-contrast
Comparison: 12/23/2015

CLINICAL DATA: Fall out of wheelchair, dementia, history of stroke

EXAM:
CT HEAD WITHOUT CONTRAST
CT CERVICAL SPINE WITHOUT CONTRAST
TECHNIQUE: Multidetector CT imaging of the head and cervical spine was
performed following the standard protocol without intravenous
contrast. Multiplanar CT image reconstructions of the cervical spine
were also generated.

[Series 2: head without · axial · non-contrast · 0.40mm/px · z∈[-135,+20]mm · 3 of 32 slices shown, 4 images]
[im 1/32  brain]
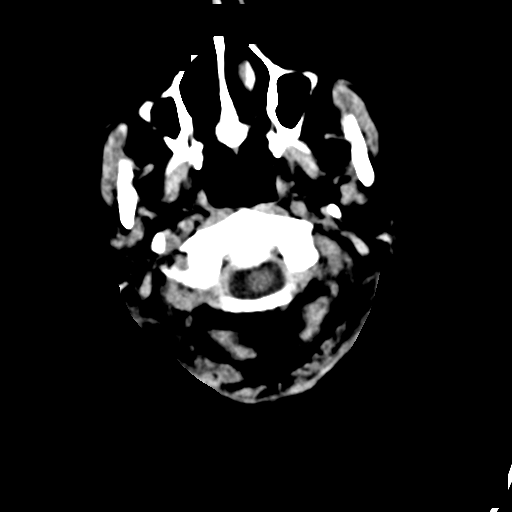
[im 1/32  bone]
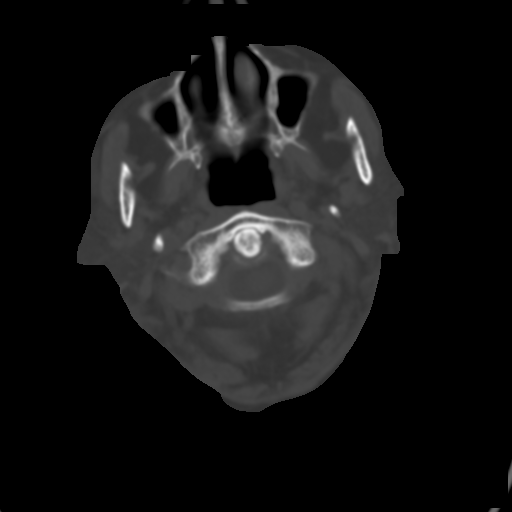
[im 16/32  brain]
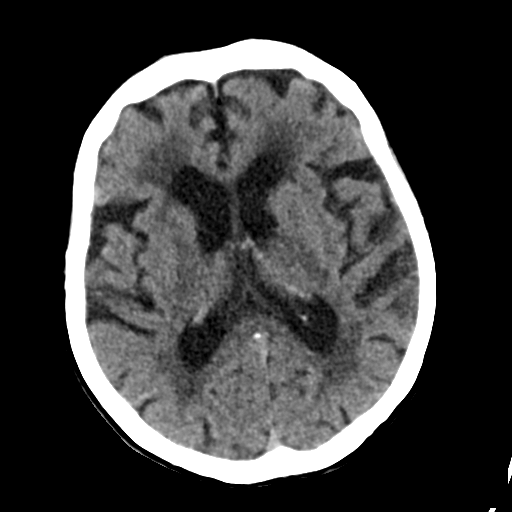
[im 32/32  brain]
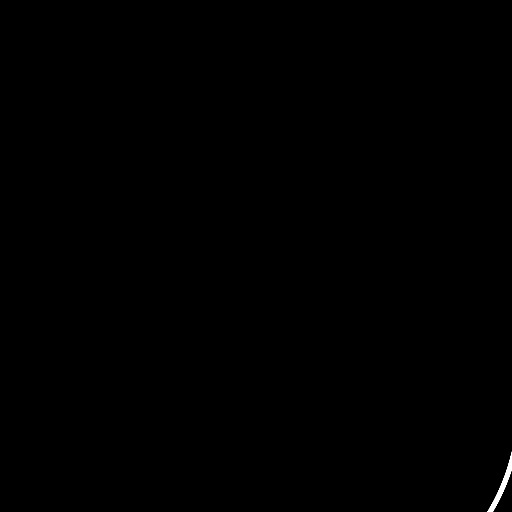

[Series 3: head bone · axial · 0.40mm/px · z∈[-117,+1]mm · 7 of 79 slices shown]
[im 10/79  bone]
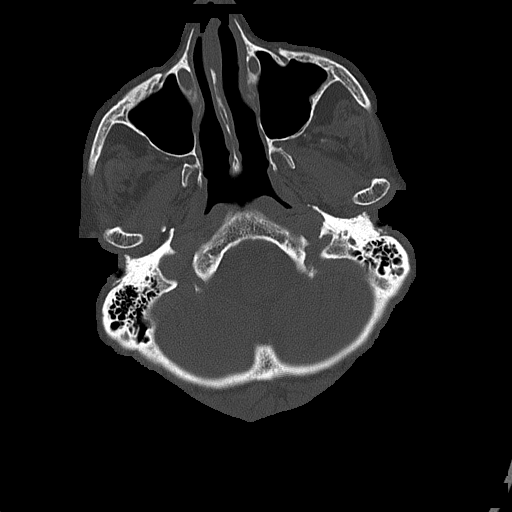
[im 20/79  bone]
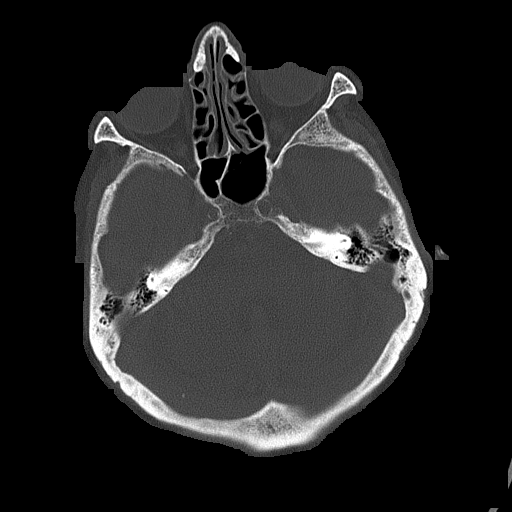
[im 30/79  bone]
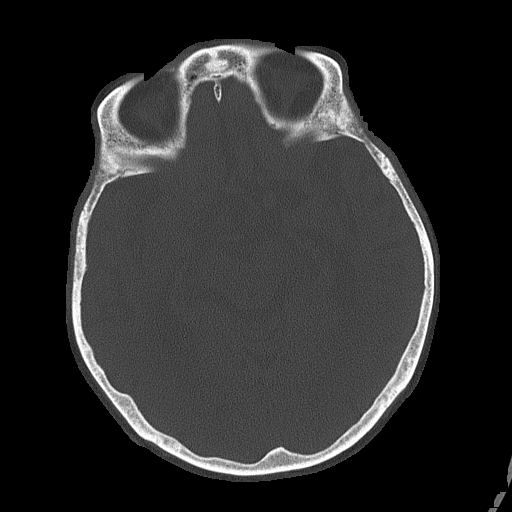
[im 40/79  bone]
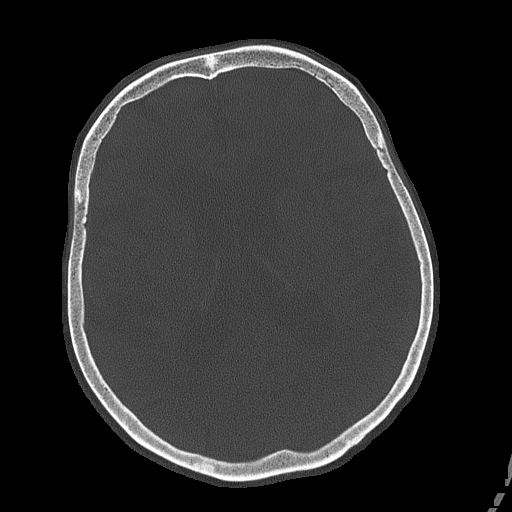
[im 49/79  bone]
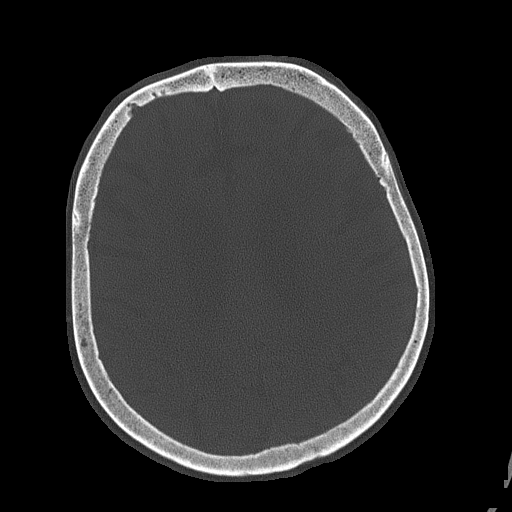
[im 59/79  bone]
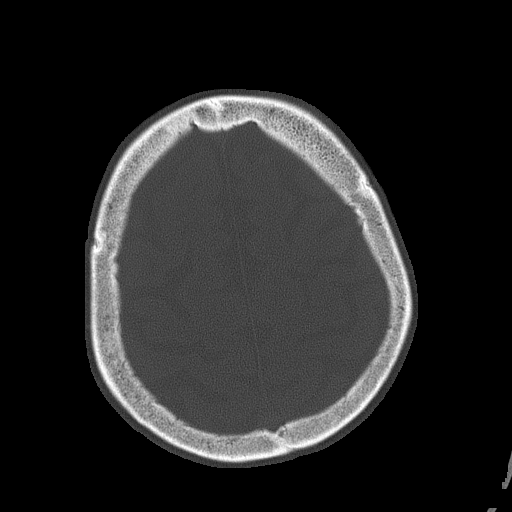
[im 69/79  bone]
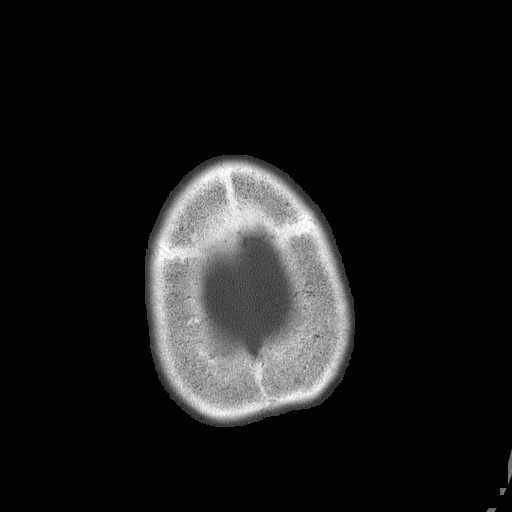

[Series 4: head without cor · coronal · non-contrast · 0.32mm/px · 3 of 67 slices shown]
[im 25/67  brain]
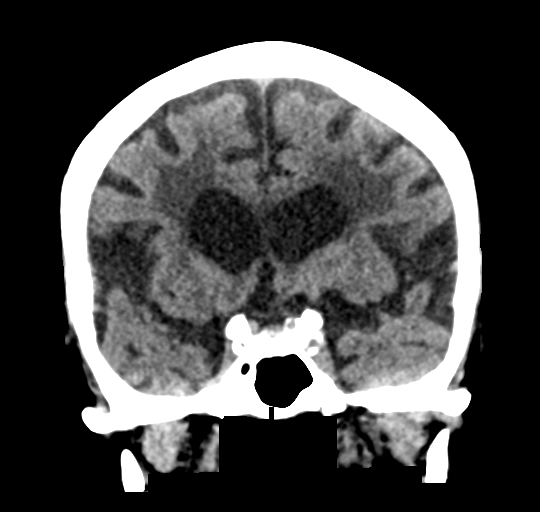
[im 34/67  brain]
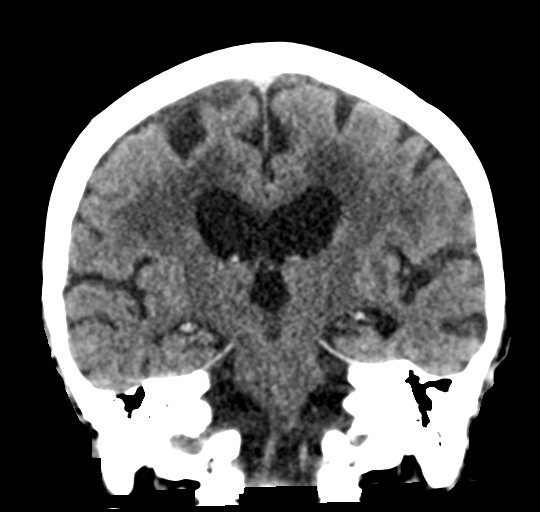
[im 42/67  brain]
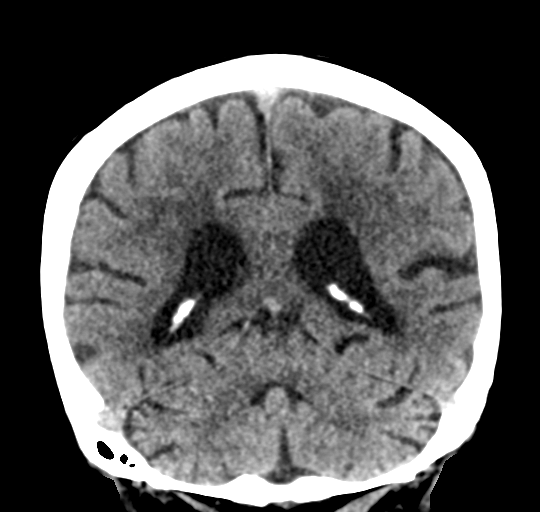

[Series 5: head without sag · sagittal · non-contrast · 0.32mm/px · 2 of 66 slices shown]
[im 22/66  brain]
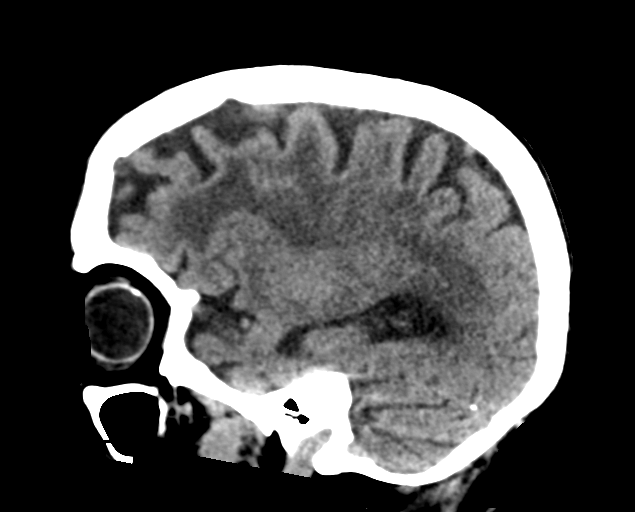
[im 44/66  brain]
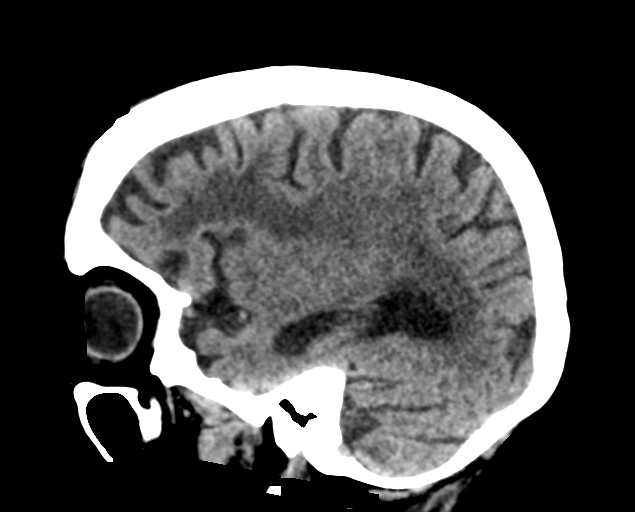

[Series 7: c_spine 2.0 st · axial · 0.26mm/px · z∈[-262,-242]mm · 2 of 77 slices shown]
[im 10/77  brain]
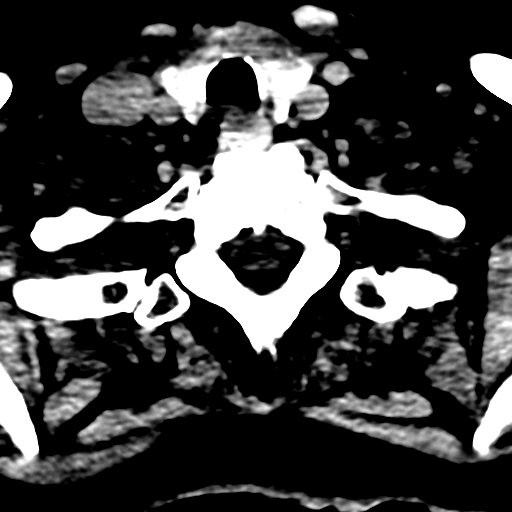
[im 20/77  brain]
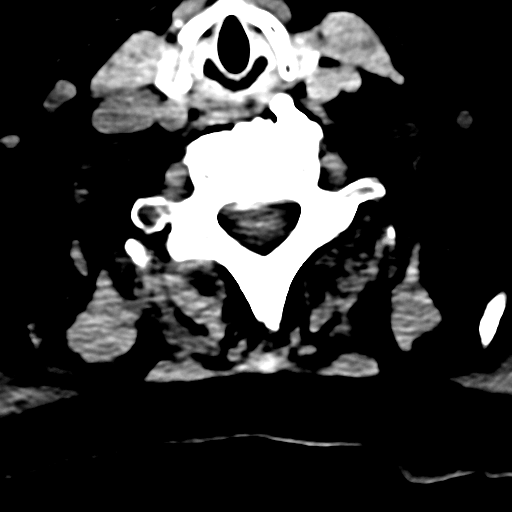

[17 of 47 positions shown; findings below may reference images not displayed]

FINDINGS: CT HEAD FINDINGS

Brain: No evidence of acute infarction, hemorrhage, hydrocephalus,
extra-axial collection or mass lesion/mass effect.

Vascular: No hyperdense vessel or unexpected calcification.

Skull: Normal. Negative for fracture or focal lesion.

Sinuses/Orbits: Mild mucosal thickening of the bilateral ethmoid
sinuses. Partial opacification of the left mastoid air cells.

Other: Global cortical atrophy.  Secondary ventriculomegaly.

Extensive subcortical white matter and periventricular small vessel
ischemic changes. Intracranial atherosclerosis.

CT CERVICAL SPINE FINDINGS

Alignment: Straightening of the normal cervical lordosis.

Skull base and vertebrae: No acute fracture. No primary bone lesion
or focal pathologic process.

Soft tissues and spinal canal: No prevertebral fluid or swelling. No
visible canal hematoma.

Disc levels: Moderate focal degenerative changes. Spinal canal
remains patent.

Upper chest: Visualized lung apices are notable for a possible small
right pleural effusion.

Other: Visualized thyroid is grossly unremarkable.
IMPRESSION: No evidence of acute intracranial abnormality. Atrophy with small
vessel ischemic changes.

No evidence of traumatic injury to the cervical spine. Moderate
multilevel degenerative changes.
